# Patient Record
Sex: Female | Born: 1965
Health system: Southern US, Community
[De-identification: ages and names within clinical notes are randomized; demographics above are authoritative.]

## PROBLEM LIST (undated history)

## (undated) DIAGNOSIS — I10 Essential (primary) hypertension: Secondary | ICD-10-CM

---

## 2002-02-28 ENCOUNTER — Encounter (INDEPENDENT_AMBULATORY_CARE_PROVIDER_SITE_OTHER): Payer: Self-pay | Admitting: *Deleted

## 2002-02-28 LAB — CONVERTED CEMR LAB

## 2002-05-28 ENCOUNTER — Encounter: Admission: RE | Admit: 2002-05-28 | Discharge: 2002-05-28 | Payer: Self-pay | Admitting: Family Medicine

## 2002-08-11 ENCOUNTER — Ambulatory Visit (HOSPITAL_COMMUNITY): Admission: RE | Admit: 2002-08-11 | Discharge: 2002-08-11 | Payer: Self-pay | Admitting: Family Medicine

## 2002-08-11 ENCOUNTER — Encounter: Payer: Self-pay | Admitting: Family Medicine

## 2002-10-29 ENCOUNTER — Inpatient Hospital Stay (HOSPITAL_COMMUNITY): Admission: RE | Admit: 2002-10-29 | Discharge: 2002-10-31 | Payer: Self-pay | Admitting: Obstetrics and Gynecology

## 2003-11-30 ENCOUNTER — Observation Stay (HOSPITAL_COMMUNITY): Admission: RE | Admit: 2003-11-30 | Discharge: 2003-12-01 | Payer: Self-pay | Admitting: *Deleted

## 2004-08-21 ENCOUNTER — Other Ambulatory Visit: Admission: RE | Admit: 2004-08-21 | Discharge: 2004-08-21 | Payer: Self-pay | Admitting: *Deleted

## 2005-04-30 HISTORY — PX: TOTAL ABDOMINAL HYSTERECTOMY W/ BILATERAL SALPINGOOPHORECTOMY: SHX83

## 2005-07-09 ENCOUNTER — Other Ambulatory Visit: Admission: RE | Admit: 2005-07-09 | Discharge: 2005-07-09 | Payer: Self-pay | Admitting: *Deleted

## 2006-06-27 DIAGNOSIS — D259 Leiomyoma of uterus, unspecified: Secondary | ICD-10-CM | POA: Insufficient documentation

## 2006-06-28 ENCOUNTER — Encounter (INDEPENDENT_AMBULATORY_CARE_PROVIDER_SITE_OTHER): Payer: Self-pay | Admitting: *Deleted

## 2006-07-22 ENCOUNTER — Other Ambulatory Visit: Admission: RE | Admit: 2006-07-22 | Discharge: 2006-07-22 | Payer: Self-pay | Admitting: *Deleted

## 2013-12-15 ENCOUNTER — Encounter (HOSPITAL_COMMUNITY): Payer: Self-pay | Admitting: Emergency Medicine

## 2013-12-15 ENCOUNTER — Emergency Department (HOSPITAL_COMMUNITY)
Admission: EM | Admit: 2013-12-15 | Discharge: 2013-12-15 | Disposition: A | Discharge status: Home / Self Care | Payer: Self-pay | Attending: Emergency Medicine | Admitting: Emergency Medicine

## 2013-12-15 DIAGNOSIS — H5710 Ocular pain, unspecified eye: Secondary | ICD-10-CM | POA: Insufficient documentation

## 2013-12-15 DIAGNOSIS — I10 Essential (primary) hypertension: Secondary | ICD-10-CM | POA: Insufficient documentation

## 2013-12-15 DIAGNOSIS — H5789 Other specified disorders of eye and adnexa: Secondary | ICD-10-CM | POA: Insufficient documentation

## 2013-12-15 DIAGNOSIS — J069 Acute upper respiratory infection, unspecified: Secondary | ICD-10-CM | POA: Insufficient documentation

## 2013-12-15 DIAGNOSIS — J029 Acute pharyngitis, unspecified: Secondary | ICD-10-CM | POA: Insufficient documentation

## 2013-12-15 HISTORY — DX: Essential (primary) hypertension: I10

## 2013-12-15 LAB — RAPID STREP SCREEN (MED CTR MEBANE ONLY): Streptococcus, Group A Screen (Direct): NEGATIVE

## 2013-12-15 MED ORDER — HYDROCODONE-ACETAMINOPHEN 7.5-325 MG/15ML PO SOLN: 15.0000 mL | ORAL | Status: DC | PRN

## 2013-12-15 MED ORDER — BENZONATATE 100 MG PO CAPS: 100.0000 mg | ORAL_CAPSULE | Freq: Three times a day (TID) | ORAL | Status: DC

## 2013-12-15 MED ORDER — DM-GUAIFENESIN ER 30-600 MG PO TB12: 1.0000 | ORAL_TABLET | Freq: Two times a day (BID) | ORAL | Status: DC

## 2013-12-15 NOTE — ED Provider Notes (Signed)
CSN: [id number]     Arrival date & time 12/15/13  1235 History  This chart was scribed for non-physician practitioner, Harvie Heck, PA-C working with Wandra Arthurs, MD by Frederich Balding, ED scribe. This patient was seen in room TR09C/TR09C and the patient's care was started at 3:06 PM.   Chief Complaint  Patient presents with  . Sore Throat  . Eye Pain   HPI Comments: Shelby Taylor is a 48 y.o. female who presents to the Emergency Department complaining of gradual onset sore throat that started 3-4 days ago. Reports associated nasal congestion, productive cough and subjective fever. States she woke up this morning with her left eye matted shut. Pt has done salt water gargles and drank hot tea with honey with no relief. Denies eye pain, visual changes.  PCP: [facility]  The history is provided by the patient. No language interpreter was used.    Past Medical History  Diagnosis Date  . Hypertension    History reviewed. No pertinent past surgical history. No family history on file. History  Substance Use Topics  . Smoking status: Never Smoker   . Smokeless tobacco: Not on file  . Alcohol Use: Not on file   OB History   Grav Para Term Preterm Abortions TAB SAB Ect Mult Living                 Review of Systems  Constitutional: Positive for fever.  HENT: Positive for congestion and sore throat.   Eyes: Positive for discharge. Negative for pain and visual disturbance.  Respiratory: Positive for cough.   All other systems reviewed and are negative.  Allergies  Review of patient's allergies indicates no known allergies.  Home Medications   Prior to Admission medications   Not on File   BP 129/79  Pulse 88  Temp(Src) 98 F (36.7 C)  Resp 16  SpO2 100%  Physical Exam  Nursing note and vitals reviewed. Constitutional: She is oriented to person, place, and time. She appears well-developed and well-nourished.  Non-toxic appearance. She does not have a sickly appearance.  She does not appear ill. No distress.  HENT:  Head: Normocephalic and atraumatic.  Nose: Rhinorrhea present.  Mouth/Throat: Uvula is midline and mucous membranes are normal. No oral lesions. No trismus in the jaw. Posterior oropharyngeal erythema present. No oropharyngeal exudate, posterior oropharyngeal edema or tonsillar abscesses.  Ears ecluded bilaterally.   Eyes: Conjunctivae, EOM and lids are normal. Right eye exhibits no chemosis and no discharge. Left eye exhibits no chemosis and no discharge. Right conjunctiva is not injected. Left conjunctiva is not injected.  Neck: Neck supple.  Pulmonary/Chest: Effort normal and breath sounds normal. No respiratory distress. She has no wheezes. She has no rhonchi. She has no rales.  Musculoskeletal: Normal range of motion.  Lymphadenopathy:       Head (right side): No submental, no submandibular, no tonsillar and no preauricular adenopathy present.       Head (left side): No submental, no submandibular, no tonsillar and no preauricular adenopathy present.    She has no cervical adenopathy.  Neurological: She is alert and oriented to person, place, and time.  Skin: Skin is warm and dry. She is not diaphoretic.  Psychiatric: She has a normal mood and affect. Her behavior is normal.    ED Course  Procedures (including critical care time)  COORDINATION OF CARE: 3:10 PM-Discussed treatment plan which includes mucinex and symptomatic treatment with pt at bedside and pt agreed to plan.  Labs Review Labs Reviewed  RAPID STREP SCREEN  CULTURE, GROUP A STREP    Imaging Review No results found.   EKG Interpretation None      MDM   Final diagnoses:  URI, acute   Pt rapid strep negative. Patients symptoms are consistent with URI, likely viral etiology. Discussed that antibiotics are not indicated for viral infections. Pt will be discharged with symptomatic treatment.  Verbalizes understanding and is agreeable with plan. Pt is  hemodynamically stable & in NAD prior to dc. Meds given in ED:  Medications - No data to display  New Prescriptions   BENZONATATE (TESSALON) 100 MG CAPSULE    Take 1 capsule (100 mg total) by mouth every 8 (eight) hours.   DEXTROMETHORPHAN-GUAIFENESIN (MUCINEX DM) 30-600 MG PER 12 HR TABLET    Take 1 tablet by mouth 2 (two) times daily.   HYDROCODONE-ACETAMINOPHEN (HYCET) 7.5-325 MG/15 ML SOLUTION    Take 15 mLs by mouth every 4 (four) hours as needed for moderate pain or severe pain.   I personally performed the services described in this documentation, which was scribed in my presence. The recorded information has been reviewed and is accurate.  Harvie Heck, PA-C 12/15/13 [phone]

## 2013-12-15 NOTE — ED Notes (Signed)
Sore throaR And eye drainage x 1 week

## 2013-12-15 NOTE — Discharge Instructions (Signed)
Call for a follow up appointment with a Family or Primary Care Provider.  Return if Symptoms worsen.   Take medication as prescribed.  Salt water gargles 3-4 times a day.

## 2013-12-16 NOTE — ED Provider Notes (Signed)
Medical screening examination/treatment/procedure(s) were performed by non-physician practitioner and as supervising physician I was immediately available for consultation/collaboration.   EKG Interpretation None        Wandra Arthurs, MD 12/16/13 1045

## 2013-12-17 LAB — CULTURE, GROUP A STREP

## 2017-01-24 ENCOUNTER — Encounter: Payer: Self-pay | Admitting: Gastroenterology

## 2017-02-05 ENCOUNTER — Encounter: Payer: Self-pay | Admitting: Specialist

## 2017-02-19 ENCOUNTER — Encounter: Payer: Self-pay | Admitting: Gastroenterology

## 2017-04-03 ENCOUNTER — Ambulatory Visit: Payer: Self-pay | Admitting: Internal Medicine

## 2017-04-03 ENCOUNTER — Encounter: Payer: Self-pay | Admitting: Internal Medicine

## 2017-04-03 ENCOUNTER — Other Ambulatory Visit: Payer: Self-pay

## 2017-04-03 VITALS — BP 143/85 | HR 94 | Temp 98.0°F | Ht 64.0 in | Wt 170.7 lb

## 2017-04-03 DIAGNOSIS — Z021 Encounter for pre-employment examination: Secondary | ICD-10-CM

## 2017-04-03 DIAGNOSIS — D259 Leiomyoma of uterus, unspecified: Secondary | ICD-10-CM

## 2017-04-03 DIAGNOSIS — I10 Essential (primary) hypertension: Secondary | ICD-10-CM | POA: Insufficient documentation

## 2017-04-03 DIAGNOSIS — R7989 Other specified abnormal findings of blood chemistry: Secondary | ICD-10-CM

## 2017-04-03 NOTE — Progress Notes (Addendum)
CC: Hypertension, establish care with clinic  HPI:  [name] is a 51 y.o. HTN who presents today to establish care with the clinic. The patient does not have any acute complaints today but has paperwork to be completed for her pre-employment/schooling physical. The patient has a Masters degree in Engineering but plans to return to school for a nursing degree. She states that she does not have many medical problems and is typically rather healthy. She only takes one medication for hypertension and does not have any difficulty taking this medication. The patient was previously established with [facility] and was told prior to leaving this group that her thyroid hormone was too low. At that time she endorsed symptoms of intermittent palpitations and diaphoresis. She still experiences these symptoms intermittently but they have not occurred as frequently in the past two weeks. Her last TSH was measured to be 0.29 on 01/29/2017. Previous records show a TSH of 0.54, free T4 of 1.5 (total T4 = 10.9), and total T3 of 120 on 01/25/2017.  The patient reports previous history of uterine fibroids, which resulted in a total abdominal hysterectomy and BSO. No other surgeries or medical conditions.   Past Medical History: Past Medical History:  Diagnosis Date  . Hypertension    Family History: Family History  Problem Relation Age of Onset  . Diabetes Mother   . Hypertension Mother   . Diabetes Maternal Grandmother   . Hypertension Maternal Grandmother   . Prostate cancer Maternal Grandfather 40  . Cervical cancer Maternal Aunt 40       Deceased   Surgical History: Past Surgical History:  Procedure Laterality Date  . TOTAL ABDOMINAL HYSTERECTOMY W/ BILATERAL SALPINGOOPHORECTOMY  2007   Hx of Fibroids, ovaries removed 2/2 cysts and adhesions to uterus; no hx of abnormal pap smear   Social History: Social History   Tobacco Use  . Smoking status: Never Smoker  . Smokeless  tobacco: Never Used  Substance Use Topics  . Alcohol use: Yes    Alcohol/week: 0.6 oz    Types: 1 Glasses of wine per week    Comment: Social  . Drug use: No   Review of Systems:   Patient endorses intermittent palpitations, as per HPI Patient denies chest pain, shortness of breath, abdominal pain, diaphoresis, nausea/vomiting, lower extremity swelling, and change in bowel/bladder habits.  Physical Exam:  Vitals:   04/03/17 1322  BP: (!) 143/85  Pulse: 94  Temp: 98 F (36.7 C)  TempSrc: Oral  SpO2: 100%  Weight: 170 lb 11.2 oz (77.4 kg)  Height: 5\' 4"  (1.626 m)    Physical Exam  Constitutional: She appears well-developed and well-nourished. No distress.  HENT:  Mouth/Throat: Oropharynx is clear and moist. No oropharyngeal exudate.  Neck: Thyromegaly (Mild diffuse thyromegaly (R>L) without discrete nodules) present.  Cardiovascular: Normal rate, regular rhythm and intact distal pulses. Exam reveals no friction rub.  No murmur heard. Respiratory: Effort normal and breath sounds normal. No respiratory distress. She has no wheezes.  No crackles appreciated  GI: Soft. Bowel sounds are normal. She exhibits no distension. There is no tenderness.  Musculoskeletal: She exhibits no edema (of bilateral lower extremities) or tenderness (of bilateral lower extremities).  Lymphadenopathy:    She has no cervical adenopathy.  Neurological:  Face strength and sensation intact bilaterally. Tongue midline. Gross motor and sensation to light touch of upper and lower extremities intact bilaterally.  Skin: Skin is warm and dry. No rash noted. She is not diaphoretic.  No erythema. No pallor.  Psychiatric: She has a normal mood and affect. Judgment and thought content normal.   Assessment & Plan:   See Encounters Tab for problem based charting.  Patient seen with Dr. Dareen Piano.

## 2017-04-03 NOTE — Patient Instructions (Addendum)
FOLLOW-UP INSTRUCTIONS When: 1-3 months with PCP For: Management of HTN and abnormal thyroid labs  Thank you for seeing [place] in the clinic today!  Please return to the clinic for your appointment with [facility].  If you have any questions or concerns, please call our clinic at [phone] between 9am-5pm and after hours call [phone] and ask for the internal medicine resident on call. If you feel you are having a medical emergency please call 911.   Dr. Thomasene Ripple

## 2017-04-03 NOTE — Assessment & Plan Note (Signed)
TSH reported to be low/low-normal at past visits in Sept and Oct 2018, without elevated free T4 on blood work. Patient not currently tachycardic or diaphoretic. No unintentional weight loss, skin/hair changes, or changes in bowel/bladder. Intermittent palpitations are concerning for hyperthyroidism, especially given report of low TSH. Patient reports normal TSH after her blood work in October but does not have this laboratory study available. Since patient self-pay at the moment, will defer testing until follow up after financial appointment with Bonna Gains later this month. Asked patient to get record of TSH/T4 testing that occurred after October 2018 and bring record to clinic.  Plan: -RTC 1-3 months (after orange card) -TSH and free T4 at follow up visit -Obtain outside records of additional TSH testing

## 2017-04-03 NOTE — Assessment & Plan Note (Addendum)
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 Patient's school also requires quantiferon gold testing for TB, does not accept two step skin testing. Will order laboratory studies today and plan to provide immunizations if needed.  Plan: -Varicella/Hepatitis B immunity -Quantiferon Gold testing  Addendum: Patient with good varicella immunity, no Hep B immunity, and Quantiferon gold pending. Will call patient to arrange nursing visit for Hep B series completion.

## 2017-04-03 NOTE — Assessment & Plan Note (Signed)
Patients BP near goal of <140/90 today. Reports taking BP at home with levels below goal on current regimen. Will not make changes at this time, but consider at follow up if BP >140/90 on repeat testing.  Plan: -Continue losartan-HCTZ 50-12.5mg  daily -BMP at follow up -RTC in 1-3 months for reevaluation

## 2017-04-03 NOTE — Progress Notes (Signed)
Internal Medicine Clinic Attending  I saw and evaluated the patient.  I personally confirmed the key portions of the history and exam documented by Dr. Nedrud and I reviewed pertinent patient test results.  The assessment, diagnosis, and plan were formulated together and I agree with the documentation in the resident's note.  

## 2017-04-04 ENCOUNTER — Encounter: Payer: Self-pay | Admitting: Internal Medicine

## 2017-04-05 ENCOUNTER — Telehealth: Payer: Self-pay | Admitting: Internal Medicine

## 2017-04-05 LAB — VARICELLA ZOSTER ABS, IGG/IGM: VARICELLA: 1614 {index} (ref >165)

## 2017-04-05 LAB — QUANTIFERON-TB GOLD PLUS
QUANTIFERON TB2 AG VALUE: 0.03 [IU]/mL
QUANTIFERON-TB GOLD PLUS: NEGATIVE
QuantiFERON Mitogen Value: >10 IU/mL
QuantiFERON Nil Value: 0.03 IU/mL
QuantiFERON TB1 Ag Value: 0.03 IU/mL

## 2017-04-05 LAB — HEPATITIS B SURFACE ANTIGEN: HEP B S AG: NEGATIVE

## 2017-04-05 LAB — HEPATITIS B SURFACE ANTIBODY,QUALITATIVE: HEP B SURFACE AB, QUAL: NONREACTIVE

## 2017-04-05 NOTE — Telephone Encounter (Addendum)
Called patient to inform her of test results showing good Varicella immunity but no Hepatitis B immunity. Informed patient that she will need Hepatitis B vaccine and will have to come to clinic to get vaccine administered.   Patient expressed understanding of this and plans to return to clinic to get vaccine early next week, as her paperwork for school is due on 04/12/2017.  Spoke to nurse Ulis Rias who is aware that patient needs vaccine and will facilitate administration of this vaccine! Thanks!

## 2017-04-05 NOTE — Telephone Encounter (Signed)
Call made to patient to see when she would like to start her 1st  Hep b vaccine.  Pt scheduled for Wed Dec 12th at 10am.  She will need papework to take with her stating she received the vaccine and is aware that it is a 3 shot vaccine.Despina Hidden Cassady12/7/20184:42 PM

## 2017-04-10 ENCOUNTER — Ambulatory Visit (INDEPENDENT_AMBULATORY_CARE_PROVIDER_SITE_OTHER): Payer: Self-pay | Admitting: *Deleted

## 2017-04-10 DIAGNOSIS — Z23 Encounter for immunization: Secondary | ICD-10-CM

## 2017-04-10 DIAGNOSIS — Z0184 Encounter for antibody response examination: Secondary | ICD-10-CM | POA: Insufficient documentation

## 2017-04-10 DIAGNOSIS — Z789 Other specified health status: Principal | ICD-10-CM

## 2017-04-16 ENCOUNTER — Ambulatory Visit: Payer: Medicaid Other

## 2017-05-10 ENCOUNTER — Ambulatory Visit (INDEPENDENT_AMBULATORY_CARE_PROVIDER_SITE_OTHER): Payer: Self-pay | Admitting: *Deleted

## 2017-05-10 DIAGNOSIS — Z789 Other specified health status: Secondary | ICD-10-CM

## 2017-05-10 DIAGNOSIS — Z0184 Encounter for antibody response examination: Secondary | ICD-10-CM

## 2017-05-10 DIAGNOSIS — Z23 Encounter for immunization: Secondary | ICD-10-CM

## 2017-05-10 MED ORDER — HEPATITIS B VAC RECOMBINANT 10 MCG/ML IJ SUSP: 1.0000 mL | Freq: Once | INTRAMUSCULAR | Status: DC

## 2017-05-13 ENCOUNTER — Telehealth: Payer: Self-pay | Admitting: Internal Medicine

## 2017-05-13 NOTE — Telephone Encounter (Signed)
CALLED PT. LMTCB, STILL MISSING PAPER WORK TO COMPLETE NEW APP FOR GCCN/CAFA

## 2017-06-03 ENCOUNTER — Other Ambulatory Visit: Payer: Self-pay

## 2017-06-03 ENCOUNTER — Encounter: Payer: Self-pay | Admitting: Internal Medicine

## 2017-06-03 ENCOUNTER — Ambulatory Visit (INDEPENDENT_AMBULATORY_CARE_PROVIDER_SITE_OTHER): Payer: Self-pay | Admitting: Internal Medicine

## 2017-06-03 VITALS — BP 150/89 | HR 108 | Temp 98.5°F | Ht 64.0 in | Wt 170.5 lb

## 2017-06-03 DIAGNOSIS — R000 Tachycardia, unspecified: Secondary | ICD-10-CM

## 2017-06-03 DIAGNOSIS — I10 Essential (primary) hypertension: Secondary | ICD-10-CM

## 2017-06-03 DIAGNOSIS — R7989 Other specified abnormal findings of blood chemistry: Secondary | ICD-10-CM

## 2017-06-03 DIAGNOSIS — E010 Iodine-deficiency related diffuse (endemic) goiter: Secondary | ICD-10-CM

## 2017-06-03 DIAGNOSIS — Z79899 Other long term (current) drug therapy: Secondary | ICD-10-CM

## 2017-06-03 DIAGNOSIS — R002 Palpitations: Secondary | ICD-10-CM

## 2017-06-03 MED ORDER — LOSARTAN POTASSIUM-HCTZ 100-12.5 MG PO TABS
1.0000 | ORAL_TABLET | Freq: Every day | ORAL | 3 refills | Status: DC
Start: 2017-06-03 — End: 2017-08-26

## 2017-06-03 NOTE — Assessment & Plan Note (Addendum)
BP elevated above goal of 140/90 today. Patient is off BP medications today, but reports taking daily at home. It sounds like BP at home is still >140 even with medications per patient report. Patient instructed on diet and lifestyle modifications, including weight loss and low salt intake, that will help with BP control. Will also increase current regimen of losartan-HCTZ 50-12.5mg  to 100-12.5 mg daily, as patient reluctant to increase diuretic. Will also plant to obtain BMP today to monitor Cr function.  Patient also reports history of elevated cholesterol and requests additional lipid panel today. Patient unsure of which cholesterol levels were elevated. Patient ate 1 hour prior to arrival. Will defer labs today and obtain fasting labs at next visit to screen for lipid abnormalities. Patient educated about fasting labs prior to next visit.  Plan: -BMP today -Increase to losartan-HCTZ 100-12.5mg  daily -Referral to clinic dietician for weight loss and low salt diet -Lipid panel at next visit -RTC in 1 month for BP recheck

## 2017-06-03 NOTE — Progress Notes (Signed)
Internal Medicine Clinic Attending  Case discussed with Dr. Nedrud at the time of the visit.  We reviewed the resident's history and exam and pertinent patient test results.  I agree with the assessment, diagnosis, and plan of care documented in the resident's note.  

## 2017-06-03 NOTE — Progress Notes (Signed)
   CC: Hypertension follow up  HPI:  Shelby Taylor is a 52 y.o. with PMH of hypertension who presents today for management of her chronic medical conditions. Patient established with clinic on 04/03/2017. At that time patient required vaccinations for school and did not have orange card approval yet. 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 Patient has been able to take all medications without difficulty, however she ran out of her normal BP medication this AM so is without medicine today. Patient takes her BP 2 times a day and states that the SBP usually runs in 130s to 140s. Denies headaches, shortness of breath, and changes in vision.   Patient states that she notices intermittent palpitations, which can occur at any time. They usually do not bother her, however she has started to notice them more since wearing a fitbit which monitors her HR. The other day during class patient noticed HR >115, which came down after she started doing some deep breathing. No diaphoresis, changes in skin, heat/cold intolerance, and unintentional weight loss.  Past Medical History: Past Medical History:  Diagnosis Date  . Hypertension    Review of Systems:   Patient endorses palpitations, as per HPI Patient denies chest pain, shortness of breath, abdominal pain, diaphoresis, nausea/vomiting, lower extremity swelling, and change in bowel/bladder habits.  Physical Exam:  Vitals:   06/03/17 1332  BP: (!) 150/89  Pulse: (!) 108  Temp: 98.5 F (36.9 C)  TempSrc: Oral  SpO2: 98%  Weight: 170 lb 8 oz (77.3 kg)  Height: 5\' 4"  (1.626 m)    Physical Exam  Constitutional: She appears well-developed and well-nourished. No distress.  HENT:  Mouth/Throat: Oropharynx is clear and moist.  Eyes: Conjunctivae are normal. Right eye exhibits no discharge. Left eye exhibits no discharge. No scleral icterus.  Neck: Thyromegaly (Diffuse, nontender  right sided thyromegaly without evidence of nodularity) present.  Cardiovascular: Regular rhythm and intact distal pulses. Exam reveals no friction rub.  No murmur heard. Tachycardic during exam  Respiratory: Effort normal. No respiratory distress. She has no wheezes. She has no rales.  GI: Soft. Bowel sounds are normal. She exhibits no distension. There is no tenderness. There is no rebound.  Musculoskeletal: Normal range of motion. She exhibits no edema (of bilateral lower extremities) or tenderness (of bilateral lower extremities).  Skin: Skin is warm and dry. No rash noted. She is not diaphoretic. No erythema.    Assessment & Plan:   See Encounters Tab for problem based charting.  Patient discussed with Dr. Lynnae January.

## 2017-06-03 NOTE — Patient Instructions (Addendum)
Thank you for seeing [place] in the clinic today!  You were evaluated for high blood pressure and abnormal thyroid laboratory studies in the past.   For your blood pressure, we increased your medication today. Please start taking losartan-HCTZ 100-12.5 mg daily. Please use the paperwork provided during today's visit to take your blood pressure at home and bring this paper to your next visit so we can see how this medication works for you. If you start to experience dizziness, particularly with standing, please stop taking this medication and call the clinic for an appointment to be evaluated.   For your thyroid, we performed some labs today. I will call you within 1 week to discuss the results of these laboratory studies.  Please return to the clinic in 4 weeks for follow up of your high blood pressure. Please try to schedule clinical dietician follow up on the same day.  If you have any questions or concerns, please call our clinic at [phone] between the hours of 9am-5pm. If you have a problem after these hours, please call [phone] and ask for the internal medicine resident on call. If you feel you are having a medical emergency please call 911.   Thanks, Dr. Larena Glassman Seryna Marek

## 2017-06-03 NOTE — Assessment & Plan Note (Addendum)
Patient has some symptoms of hyperthyroidism, including possible enlargement of thyroid gland, intermittent tachycardia and palpitations. Patient does not have unintentional weight loss or other symptoms of hyperthyroid disease. TSH was 0.29 on 01/29/2017 at outside clinic. Will plan to repeat testing today (with free T3/T4 levels today) and follow up with possible treatment of underlying hyperthyroidism if indicated.  Plan: -TSH, free T3/4 today -Patient will be contacted with results later this week

## 2017-06-04 LAB — BMP8+ANION GAP
Anion Gap: 17 mmol/L (ref 10.0–18.0)
BUN / CREAT RATIO: 17 (ref 9–23)
BUN: 11 mg/dL (ref 6–24)
CO2: 25 mmol/L (ref 20–29)
CREATININE: 0.64 mg/dL (ref 0.57–1.00)
Calcium: 9.4 mg/dL (ref 8.7–10.2)
Chloride: 104 mmol/L (ref 96–106)
GFR calc Af Amer: 119 mL/min/{1.73_m2} (ref >59)
GFR calc non Af Amer: 104 mL/min/{1.73_m2} (ref >59)
Glucose: 125 mg/dL — ABNORMAL HIGH (ref 65–99)
Potassium: 4 mmol/L (ref 3.5–5.2)
SODIUM: 146 mmol/L — AB (ref 134–144)

## 2017-06-04 LAB — T4, FREE: FREE T4: 1.32 ng/dL (ref 0.82–1.77)

## 2017-06-04 LAB — T3, FREE: T3, Free: 3.5 pg/mL (ref 2.0–4.4)

## 2017-06-04 LAB — TSH: TSH: 0.663 u[IU]/mL (ref 0.450–4.500)

## 2017-06-26 ENCOUNTER — Encounter: Payer: Self-pay | Admitting: Internal Medicine

## 2017-08-26 ENCOUNTER — Encounter: Payer: Self-pay | Admitting: Internal Medicine

## 2017-08-26 ENCOUNTER — Ambulatory Visit (INDEPENDENT_AMBULATORY_CARE_PROVIDER_SITE_OTHER): Payer: Self-pay | Admitting: Internal Medicine

## 2017-08-26 ENCOUNTER — Encounter: Payer: Medicaid Other | Admitting: Internal Medicine

## 2017-08-26 ENCOUNTER — Other Ambulatory Visit: Payer: Self-pay

## 2017-08-26 ENCOUNTER — Ambulatory Visit: Payer: Medicaid Other | Admitting: Dietician

## 2017-08-26 VITALS — BP 151/96 | HR 83 | Temp 98.1°F | Ht 64.0 in | Wt 170.6 lb

## 2017-08-26 DIAGNOSIS — Z90710 Acquired absence of both cervix and uterus: Secondary | ICD-10-CM

## 2017-08-26 DIAGNOSIS — Z23 Encounter for immunization: Secondary | ICD-10-CM

## 2017-08-26 DIAGNOSIS — Z8742 Personal history of other diseases of the female genital tract: Secondary | ICD-10-CM

## 2017-08-26 DIAGNOSIS — Z79899 Other long term (current) drug therapy: Secondary | ICD-10-CM

## 2017-08-26 DIAGNOSIS — Z1231 Encounter for screening mammogram for malignant neoplasm of breast: Secondary | ICD-10-CM

## 2017-08-26 DIAGNOSIS — I10 Essential (primary) hypertension: Secondary | ICD-10-CM

## 2017-08-26 DIAGNOSIS — Z0000 Encounter for general adult medical examination without abnormal findings: Secondary | ICD-10-CM | POA: Insufficient documentation

## 2017-08-26 MED ORDER — LOSARTAN POTASSIUM-HCTZ 50-12.5 MG PO TABS: 1.0000 | ORAL_TABLET | Freq: Every day | ORAL | 3 refills | Status: DC

## 2017-08-26 NOTE — Patient Instructions (Addendum)
Thank you for seeing [place] in the clinic today!  You were evaluated for high blood pressure. Please continue taking your combination pill as previously prescribed and checking your blood pressure daily - you are doing great!  You were given a referral for mammogram today. Please fill out the paper work for a potential scholarship for this exam and ask them about this paperwork when they call you to schedule this test.  Please return to the clinic in 3-6 months for follow up of your chronic medical conditions.   If you have any questions or concerns, please call our clinic at [phone] between the hours of 9am-5pm. If you have a problem after these hours, please call [phone] and ask for the internal medicine resident on call. If you feel you are having a medical emergency please call 911.   Thanks, Dr. Larena Glassman Shelby Taylor

## 2017-08-26 NOTE — Progress Notes (Signed)
   CC: HTN Follow up  HPI:  [name] is a 52 y.o. with PMH of hypertension who presents for management of this chronic medical condition. At last visit patient's BP elevated and dose of losartan-HCTZ increased from 50-12.5mg  to 100-12.5mg  daily. Patient states that since her last clinic visit on 05/2017 she has felt overall well. She made the above recommended change to BP regimen for a short while but noticed a change in her urine output after implementing this change. No dysuria, or increased urinary frequency, but she noticed a change in odor of urine. Patient self-decreased dose back to 50-12.5 mg and noticed resolution of urinary symptoms after this change. NO headaches, focal weakness, chest pain, or dyspnea.  Past Medical History: Past Medical History:  Diagnosis Date  . Hypertension    Review of Systems:  Patient denies abdominal pain, diaphoresis, nausea/vomiting, lower extremity swelling, and change in bowel/bladder habits.  Physical Exam:  Vitals:   08/26/17 1552  BP: (!) 147/94  Pulse: 84  Temp: 98.1 F (36.7 C)  TempSrc: Oral  SpO2: 100%  Weight: 170 lb 9.6 oz (77.4 kg)  Height: 5\' 4"  (1.626 m)   Physical Exam  Constitutional: She appears well-developed and well-nourished. No distress.  HENT:  Mouth/Throat: Oropharynx is clear and moist.  Eyes: Conjunctivae and EOM are normal.  Cardiovascular: Normal rate, regular rhythm and intact distal pulses. Exam reveals no friction rub.  No murmur heard. Respiratory: Effort normal. No respiratory distress. She has no wheezes. She has no rales.  Musculoskeletal: She exhibits no edema (of bilateral lower extremities) or tenderness (of bilateral lower extremities).  Skin: Skin is warm and dry. No rash noted. She is not diaphoretic. No erythema.     Assessment & Plan:   See Encounters Tab for problem based charting.  Patient discussed with Dr. Angelia Mould.

## 2017-08-26 NOTE — Progress Notes (Signed)
  Medical Nutrition Therapy:  Appt start time: 0017 end time:  4944 Visit # 1  Assessment:  Primary concerns today: help with diet for high blood pressure.  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 She has noticed high blood pressure it the past despite what she thought was a healthy diet. She began eating a vegan diet with green smoothies at least once daily and saw a significant decrease in her blood pressure. She has since added back some animal sources of protein and is trying to adhere to a mostly vegetarian diet.   Preferred Learning Style: no preference indicated  Learning Readiness:Ready and Change in progress  ANTHROPOMETRICS:Estimated body mass index is 29.28 kg/m as calculated from the following:   Height as of an earlier encounter on 08/26/17: 5\' 4"  (1.626 m).   Weight as of an earlier encounter on 08/26/17: 170 lb 9.6 oz (77.4 kg). WEIGHT HISTORY: 150-170#, prefers 150# but realizes it may take her a while to get there MEDICATIONS:  DIETARY INTAKE: Usual eating pattern includes 3 meals and unsure how many snacks per day. Everyday foods include nuts, eggs, vegan lunch meat, fish, vegetables, fruits.  Avoided foods include meats on most days.  Dining Out: yes  24-hr recall:  B ( AM): green smoothie or 2 boiled eggs and vegan sausage, fruit  L ( PM): green smoothie D ( PM): fish and vegetables Snks: mixture of unsalted nuts Beverages: tea, water  Usual physical activity: 6000-10000 steps a day  Progress Towards Goal(s):  In progress.   Nutritional Diagnosis:  NB-1.1 Food and nutrition-related knowledge deficit As related to lack of sufficent training to be confident in her dietary knowlege .  As evidenced by her questions and concerns and dietary changes.    Intervention:  Nutrition education about DASH diet, menu and recipe ideas and importance eof maintaining physical acitivity. Agree with moderate (15#) weight loss)  Coordination of care: consider de-escalating  medications as possible Teaching Method Utilized: Visual, Auditory, Hands on Handouts given during visit include:AVS and DASH diet Barriers to learning/adherence to lifestyle change: competing values Demonstrated degree of understanding via:  Teach Back   Monitoring/Evaluation:  Dietary intake, exercise, and body weight prn  Debera Lat, RD 08/26/2017 4:26 PM. .

## 2017-08-26 NOTE — Assessment & Plan Note (Signed)
Patient needs TDAP today - vaccine given  Patient also needs screening mammography - referral and application for scholarship given.  Patient requested pap smear at future visit (had to leave clinic today for scheduled class) - patient had hysterectomy for fibroids and no abnormal pap smears in the past. Discussed that patient likely does not need pap smear given this history (but absence of records for hysterectomy in our system).   Discussed FIT testing with patient vs. Colonoscopy (which patient reluctant to try at this time). Recommend introducing FIT testing at next visit.  Patient not yet tested for HIV - reports negative test in the past but amenable for repeat testing in the future.

## 2017-08-26 NOTE — Assessment & Plan Note (Signed)
Patient's BP elevated >140/90 today. Patient takes BP twice daily (once in AM and once before bedtime). She states that her SBP at home ranges from 100-130, never as high as what was observed today in clinic. Patient self decreased losartan from 100 mg to 50 mg after last clinic visit. Patient reluctant to add new medication to regimen today, especially after meeting with clinic dietician regarding lifestyle and diet modifications for further management of HTN. Patient would like to hold off on making changes to BP regimen so that she can see if weight loss affects BP.   Plan: -Continue losartan -HCTZ 50-12.5mg  daily -Diet and lifestyle modifications -RTC in 3-6 months for evaluation. Would suggest addition of low dose amlodipine to regimen if BP elevated at that time.

## 2017-08-26 NOTE — Patient Instructions (Signed)
Dear Jonne Ply,  It was a pleasure meeting you! You are doing a great job transitioning your diet to just about a DASH diet. This diet has been proven to lwoer blood pressure. I recommend you continue this for life.    Here is the website for more about DASH diet meal planning: NightlifePreviews.ch  Maintaining a moderate level of Physical activity throughout life is very important for [place] to utilize the good nutrtion the food is giving [place].   Here are current minimal recommendations/ Your Exercise Prescription:   RX:  1- At least 150 minutes a week of moderate intensity exercise. ( 50 minutes on 3 days or 30 minues on 5 days or however it works for you!)  Trying not to skip more than 2 days with no exercise. Walking, bicycling, stationary bicycling, dancing.  (moderate intensity means to get  a little out of breath)  2- Do resistance exercise at least 2 times a week.  This can be yoga poses or strength training where you lift your own weight (think leg lifts or toe raises)  or light weights like cans of beans with your arms or buy some inexpensive dumbbells (play it again sports or [employer] or [employer] dress for less)  To do light weights with exercise - arms- curls, tricep extension or dips, push ups- can do on knees or on the wall - legs- squats, Lunges, calf raises on steps or block  3- Flexibility and balance exercise-yoga, stretches,  safe stretching and practicing balance is very important to health.   Call or stop by anytime! Butch Penny [phone]

## 2017-08-27 NOTE — Progress Notes (Signed)
Internal Medicine Clinic Attending  Case discussed with Dr. Nedrud at the time of the visit.  We reviewed the resident's history and exam and pertinent patient test results.  I agree with the assessment, diagnosis, and plan of care documented in the resident's note.  

## 2017-09-12 ENCOUNTER — Ambulatory Visit: Payer: Medicaid Other | Admitting: Dietician

## 2017-10-15 ENCOUNTER — Ambulatory Visit (INDEPENDENT_AMBULATORY_CARE_PROVIDER_SITE_OTHER): Payer: Self-pay | Admitting: *Deleted

## 2017-10-15 DIAGNOSIS — Z23 Encounter for immunization: Secondary | ICD-10-CM

## 2017-10-16 ENCOUNTER — Encounter: Payer: Self-pay | Admitting: *Deleted

## 2017-11-13 ENCOUNTER — Ambulatory Visit: Payer: Medicaid Other

## 2018-03-04 ENCOUNTER — Telehealth: Payer: Self-pay

## 2018-03-04 ENCOUNTER — Other Ambulatory Visit: Payer: Self-pay | Admitting: *Deleted

## 2018-03-04 NOTE — Telephone Encounter (Signed)
Attempted to call pt back, do not understand message, lm for rtc

## 2018-03-04 NOTE — Telephone Encounter (Signed)
Received return phone call from pt.  She is requesting a TB Quantiferon Blood test for entry into nursing school. Will route to pcp.  [place], RN

## 2018-03-04 NOTE — Telephone Encounter (Signed)
Requesting blood work for tb. Please call pt back.

## 2019-09-15 DIAGNOSIS — I10 Essential (primary) hypertension: Secondary | ICD-10-CM | POA: Insufficient documentation

## 2019-09-29 ENCOUNTER — Encounter: Payer: Self-pay | Admitting: Nurse Practitioner

## 2019-09-29 ENCOUNTER — Ambulatory Visit: Payer: 59 | Admitting: Nurse Practitioner

## 2019-09-29 VITALS — BP 142/86 | HR 90 | Temp 98.3°F | Ht 64.6 in | Wt 181.6 lb

## 2019-09-29 DIAGNOSIS — R3121 Asymptomatic microscopic hematuria: Secondary | ICD-10-CM | POA: Diagnosis not present

## 2019-09-29 DIAGNOSIS — Z1211 Encounter for screening for malignant neoplasm of colon: Secondary | ICD-10-CM | POA: Diagnosis not present

## 2019-09-29 DIAGNOSIS — Z1231 Encounter for screening mammogram for malignant neoplasm of breast: Secondary | ICD-10-CM

## 2019-09-29 DIAGNOSIS — I10 Essential (primary) hypertension: Secondary | ICD-10-CM | POA: Diagnosis not present

## 2019-09-29 DIAGNOSIS — E7800 Pure hypercholesterolemia, unspecified: Secondary | ICD-10-CM

## 2019-09-29 DIAGNOSIS — Z13228 Encounter for screening for other metabolic disorders: Secondary | ICD-10-CM

## 2019-09-29 LAB — POCT URINALYSIS DIPSTICK
Bilirubin, UA: NEGATIVE
Glucose, UA: NEGATIVE
Ketones, UA: NEGATIVE
Leukocytes, UA: NEGATIVE
Nitrite, UA: NEGATIVE
Protein, UA: NEGATIVE
Spec Grav, UA: 1.025 (ref 1.010–1.025)
Urobilinogen, UA: 0.2 E.U./dL
pH, UA: 5.5 (ref 5.0–8.0)

## 2019-09-29 LAB — POCT UA - MICROALBUMIN
Albumin/Creatinine Ratio, Urine, POC: 30
Creatinine, POC: 200 mg/dL
Microalbumin Ur, POC: 10 mg/L

## 2019-09-29 NOTE — Progress Notes (Signed)
This visit occurred during the SARS-CoV-2 public health emergency.  Safety protocols were in place, including screening questions prior to the visit, additional usage of staff PPE, and extensive cleaning of exam room while observing appropriate contact time as indicated for disinfecting solutions.  Subjective:     Patient ID: Shelby Taylor , female    DOB: Jun 24, 1965 , 54 y.o.   MRN: [id number]   Chief Complaint  Patient presents with  . Establish Care    HPI  Here to establish care. She had been living in [place].  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 She is also a Surveyor, quantity.  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 She is planning to go back for her BSN at [employer].   She is interested in Aesthetics.  She reports she has gained approximately 30 pounds while in nursing school.  Single. She is raising her nephew who is 94 y/o.    She has recently had a career change to nursing.   PMH - Myomectomy, partial hysterectomy, total hysterectomy. Used to have fibroids. She had done hormone therapy.  HTN (she thinks she may not have taken her medications this morning) (since 2018), night worker (few weeks). She had cyst to her ears which were removed.   [facility] - Father - deceased (gunshot), Mother - glaucoma, HTN, diabetes, TB (when patient was 66 years old). Brother (oldest) - possibly prediabetes, Sister - overall healthy Maternal aunt with colon cancer (deceased) and maternal great aunt (colon cancer).   She had the covid vaccine - 2nd vaccine caused her fatigue, 2 weeks later had soreness to her left arm.    Hypertension This is a chronic problem. The current episode started more than 1 year ago. Pertinent negatives include no chest pain, headaches or palpitations. Risk factors for coronary artery disease include obesity and sedentary lifestyle. Past  treatments include diuretics. The current treatment provides mild improvement. There are no compliance problems.  There is no history of angina. There is no history of chronic renal disease.     Past Medical History:  Diagnosis Date  . Hypertension      Family History  Problem Relation Age of Onset  . Diabetes Mother   . Hypertension Mother   . Diabetes Maternal Grandmother   . Hypertension Maternal Grandmother   . Prostate cancer Maternal Grandfather 49  . Cervical cancer Maternal Aunt 40       Deceased     Current Outpatient Medications:  .  lisinopril-hydrochlorothiazide (ZESTORETIC) 20-25 MG tablet, Take 1 tablet by mouth daily., Disp: , Rfl:  .  losartan-hydrochlorothiazide (HYZAAR) 50-12.5 MG tablet, Take 1 tablet by mouth daily., Disp: 90 tablet, Rfl: 3   Allergies  Allergen Reactions  . Morphine And Related     Itching     Review of Systems  Constitutional: Negative.   Respiratory: Negative.   Cardiovascular: Negative.  Negative for chest pain, palpitations and leg swelling.  Neurological: Negative for dizziness and headaches.  Psychiatric/Behavioral: Negative.      Today's Vitals   09/29/19 1125  BP: (!) 142/86  Pulse: 90  Temp: 98.3 F (36.8 C)  TempSrc: Oral  Weight: 181 lb 9.6 oz (82.4 kg)  Height: 5' 4.6" (1.641 m)  PainSc: 0-No pain   Body mass index is 30.6 kg/m.   Objective:  Physical Exam Constitutional:  General: She is not in acute distress.    Appearance: Normal appearance.  Cardiovascular:     Rate and Rhythm: Normal rate and regular rhythm.     Pulses: Normal pulses.     Heart sounds: Normal heart sounds. No murmur.  Pulmonary:     Effort: Pulmonary effort is normal. No respiratory distress.     Breath sounds: Normal breath sounds.  Skin:    Capillary Refill: Capillary refill takes less than 2 seconds.  Neurological:     General: No focal deficit present.     Mental Status: She is alert and oriented to person, place, and  time.     Cranial Nerves: No cranial nerve deficit.  Psychiatric:        Mood and Affect: Mood normal.        Behavior: Behavior normal.        Thought Content: Thought content normal.        Judgment: Judgment normal.         Assessment And Plan:     1. Essential hypertension  Chronic, Taylor pressure is slightly elevated I will not make any changes at this time, this is her visit to the office  She is to continue with her medications and avoid salt - CMP14+EGFR  2. Asymptomatic microscopic hematuria  Positive Taylor in urine will send for culture - Culture, Urine  3. Elevated cholesterol  Previous labs indicate elevated cholesterol levels  Will check lipid panel.  - Lipid panel  4. Encounter for screening colonoscopy  According to USPTF Colorectal cancer Screening guidelines. Colonoscopy is recommended every 10 years, starting at age 55years.  Will refer to GI for colon cancer screening. - Ambulatory referral to Gastroenterology  5. Encounter for screening mammogram for malignant neoplasm of breast  Pt instructed on Self Breast Exam.According to ACOG guidelines Women aged 54 and older are recommended to get an annual mammogram. Form completed and given to patient contact the [facility] for appointment scheduing.   Pt encouraged to get annual mammogram - MM DIGITAL SCREENING BILATERAL; Future  6. Encounter for screening for metabolic disorder  - Hemoglobin A1c - CMP14+EGFR   [name], FNP    THE PATIENT IS ENCOURAGED TO PRACTICE SOCIAL DISTANCING DUE TO THE COVID-19 PANDEMIC.

## 2019-10-13 DIAGNOSIS — R3121 Asymptomatic microscopic hematuria: Secondary | ICD-10-CM | POA: Diagnosis not present

## 2019-10-13 DIAGNOSIS — E7800 Pure hypercholesterolemia, unspecified: Secondary | ICD-10-CM | POA: Diagnosis not present

## 2019-10-13 DIAGNOSIS — I10 Essential (primary) hypertension: Secondary | ICD-10-CM | POA: Diagnosis not present

## 2019-10-13 DIAGNOSIS — Z13228 Encounter for screening for other metabolic disorders: Secondary | ICD-10-CM | POA: Diagnosis not present

## 2019-10-14 LAB — CMP14+EGFR
ALT: 17 IU/L (ref 0–32)
AST: 19 IU/L (ref 0–40)
Albumin/Globulin Ratio: 1.7 (ref 1.2–2.2)
Albumin: 4.4 g/dL (ref 3.8–4.9)
Alkaline Phosphatase: 89 IU/L (ref 48–121)
BUN/Creatinine Ratio: 18 (ref 9–23)
BUN: 12 mg/dL (ref 6–24)
Bilirubin Total: 0.5 mg/dL (ref 0.0–1.2)
CO2: 22 mmol/L (ref 20–29)
Calcium: 9.6 mg/dL (ref 8.7–10.2)
Chloride: 103 mmol/L (ref 96–106)
Creatinine, Ser: 0.67 mg/dL (ref 0.57–1.00)
GFR calc Af Amer: 115 mL/min/{1.73_m2} (ref >59)
GFR calc non Af Amer: 100 mL/min/{1.73_m2} (ref >59)
Globulin, Total: 2.6 g/dL (ref 1.5–4.5)
Glucose: 103 mg/dL — ABNORMAL HIGH (ref 65–99)
Potassium: 3.9 mmol/L (ref 3.5–5.2)
Sodium: 140 mmol/L (ref 134–144)
Total Protein: 7 g/dL (ref 6.0–8.5)

## 2019-10-14 LAB — LIPID PANEL
Chol/HDL Ratio: 3.9 ratio (ref 0.0–4.4)
Cholesterol, Total: 226 mg/dL — ABNORMAL HIGH (ref 100–199)
HDL: 58 mg/dL (ref >39)
LDL Chol Calc (NIH): 159 mg/dL — ABNORMAL HIGH (ref 0–99)
Triglycerides: 54 mg/dL (ref 0–149)
VLDL Cholesterol Cal: 9 mg/dL (ref 5–40)

## 2019-10-14 LAB — URINE CULTURE: Organism ID, Bacteria: NO GROWTH

## 2019-10-14 LAB — HEMOGLOBIN A1C
Est. average glucose Bld gHb Est-mCnc: 111 mg/dL
Hgb A1c MFr Bld: 5.5 % (ref 4.8–5.6)

## 2019-10-22 ENCOUNTER — Other Ambulatory Visit: Payer: Self-pay | Admitting: Nurse Practitioner

## 2019-10-22 ENCOUNTER — Encounter: Payer: Self-pay | Admitting: Gastroenterology

## 2019-10-22 DIAGNOSIS — Z1231 Encounter for screening mammogram for malignant neoplasm of breast: Secondary | ICD-10-CM

## 2019-11-05 ENCOUNTER — Other Ambulatory Visit: Payer: Self-pay

## 2019-11-05 ENCOUNTER — Ambulatory Visit (HOSPITAL_COMMUNITY)
Admission: EM | Admit: 2019-11-05 | Discharge: 2019-11-05 | Disposition: A | Discharge status: Home / Self Care | Payer: 59 | Attending: Urgent Care | Admitting: Urgent Care

## 2019-11-05 ENCOUNTER — Encounter (HOSPITAL_COMMUNITY): Payer: Self-pay

## 2019-11-05 DIAGNOSIS — Z20822 Contact with and (suspected) exposure to covid-19: Secondary | ICD-10-CM | POA: Insufficient documentation

## 2019-11-05 DIAGNOSIS — R42 Dizziness and giddiness: Secondary | ICD-10-CM | POA: Insufficient documentation

## 2019-11-05 DIAGNOSIS — R519 Headache, unspecified: Secondary | ICD-10-CM | POA: Diagnosis not present

## 2019-11-05 DIAGNOSIS — J3089 Other allergic rhinitis: Secondary | ICD-10-CM

## 2019-11-05 DIAGNOSIS — Z885 Allergy status to narcotic agent status: Secondary | ICD-10-CM | POA: Diagnosis not present

## 2019-11-05 DIAGNOSIS — R0981 Nasal congestion: Secondary | ICD-10-CM | POA: Insufficient documentation

## 2019-11-05 DIAGNOSIS — J309 Allergic rhinitis, unspecified: Secondary | ICD-10-CM | POA: Diagnosis not present

## 2019-11-05 DIAGNOSIS — Z90722 Acquired absence of ovaries, bilateral: Secondary | ICD-10-CM | POA: Diagnosis not present

## 2019-11-05 NOTE — ED Provider Notes (Signed)
[place]   MRN: [id number] DOB: Jun 28, 1965  Subjective:   Shelby Taylor is a 54 y.o. female presenting for 4 day hx of persistent dizziness/vertigo, sinus headaches, pnd. Has used Zyrtec, Flonase with minimal relief. Had good relief the first day. Has had COVID vaccination. Denies smoking, asthma, COPD.   No current facility-administered medications for this encounter.  Current Outpatient Medications:    lisinopril-hydrochlorothiazide (ZESTORETIC) 20-25 MG tablet, Take 1 tablet by mouth daily., Disp: , Rfl:    Allergies  Allergen Reactions   Morphine And Related     Itching    Past Medical History:  Diagnosis Date   Hypertension      Past Surgical History:  Procedure Laterality Date   TOTAL ABDOMINAL HYSTERECTOMY W/ BILATERAL SALPINGOOPHORECTOMY  2007   Hx of Fibroids, ovaries removed 2/2 cysts and adhesions to uterus; no hx of abnormal pap smear    Family History  Problem Relation Age of Onset   Diabetes Mother    Hypertension Mother    Diabetes Maternal Grandmother    Hypertension Maternal Grandmother    Prostate cancer Maternal Grandfather 71   Cervical cancer Maternal Aunt 40       Deceased    Social History   Tobacco Use   Smoking status: Never Smoker   Smokeless tobacco: Never Used  Vaping Use   Vaping Use: Never used  Substance Use Topics   Alcohol use: Yes    Alcohol/week: 1.0 standard drink    Types: 1 Glasses of wine per week    Comment: Social   Drug use: No    ROS   Objective:   Vitals: BP (!) 159/97    Pulse 88    Temp 98.5 F (36.9 C) (Oral)    Resp 16    Ht 5\' 4"  (1.626 m)    Wt 170 lb (77.1 kg)    LMP 08/26/2017    SpO2 100%    BMI 29.18 kg/m   Physical Exam Constitutional:      General: She is not in acute distress.    Appearance: Normal appearance. She is well-developed. She is not ill-appearing, toxic-appearing or diaphoretic.  HENT:     Head: Normocephalic and atraumatic.     Right Ear:  Tympanic membrane, ear canal and external ear normal. No drainage or tenderness. No middle ear effusion. Tympanic membrane is not erythematous.     Left Ear: Tympanic membrane, ear canal and external ear normal. No drainage or tenderness.  No middle ear effusion. Tympanic membrane is not erythematous.     Nose: Nose normal. No congestion or rhinorrhea.     Mouth/Throat:     Mouth: Mucous membranes are moist. No oral lesions.     Pharynx: No pharyngeal swelling, oropharyngeal exudate, posterior oropharyngeal erythema or uvula swelling.     Tonsils: No tonsillar exudate or tonsillar abscesses.     Comments: Significant post-nasal drainage.  Eyes:     General: No scleral icterus.       Right eye: No discharge.        Left eye: No discharge.     Extraocular Movements: Extraocular movements intact.     Right eye: Normal extraocular motion.     Left eye: Normal extraocular motion.     Conjunctiva/sclera: Conjunctivae normal.     Pupils: Pupils are equal, round, and reactive to light.  Cardiovascular:     Rate and Rhythm: Normal rate.  Pulmonary:     Effort: Pulmonary effort is normal.  Musculoskeletal:  Cervical back: Normal range of motion and neck supple.  Lymphadenopathy:     Cervical: No cervical adenopathy.  Skin:    General: Skin is warm and dry.  Neurological:     General: No focal deficit present.     Mental Status: She is alert and oriented to person, place, and time.  Psychiatric:        Mood and Affect: Mood normal.        Behavior: Behavior normal.        Thought Content: Thought content normal.        Judgment: Judgment normal.     Assessment and Plan :   PDMP not reviewed this encounter.  1. Dizziness   2. Allergic rhinitis due to other allergic trigger, unspecified seasonality   3. Sinus headache   4. Nasal congestion     Will use oral prednisone course for rhinitis. Maintain Zyrtec, hold Flonase. COVID testing pending.  Counseled patient on potential for  adverse effects with medications prescribed/recommended today, ER and return-to-clinic precautions discussed, patient verbalized understanding.    Jaynee Eagles, [place] 11/05/19 1929

## 2019-11-05 NOTE — ED Triage Notes (Signed)
Pt states woke up Tues with vertigo and states its not getting better after taking allergy meds to help per PCP. Pt states it helped some the first day, but not helping anymore.

## 2019-11-06 ENCOUNTER — Telehealth: Payer: Self-pay | Admitting: Internal Medicine

## 2019-11-06 LAB — SARS CORONAVIRUS 2 (TAT 6-24 HRS): SARS Coronavirus 2: NEGATIVE

## 2019-11-06 MED ORDER — PREDNISONE 20 MG PO TABS
20.0000 mg | ORAL_TABLET | Freq: Every day | ORAL | 0 refills | Status: AC
Start: 2019-11-06 — End: 2019-11-11

## 2019-11-06 NOTE — Telephone Encounter (Signed)
Prescription didn't go through to the pharmacy. Prescription has been sent again.

## 2019-11-19 ENCOUNTER — Ambulatory Visit
Admission: RE | Admit: 2019-11-19 | Discharge: 2019-11-19 | Disposition: A | Discharge status: Home / Self Care | Payer: 59 | Source: Ambulatory Visit | Attending: Nurse Practitioner | Admitting: Nurse Practitioner

## 2019-11-19 DIAGNOSIS — Z1231 Encounter for screening mammogram for malignant neoplasm of breast: Secondary | ICD-10-CM

## 2019-11-27 ENCOUNTER — Other Ambulatory Visit: Payer: Self-pay

## 2019-11-27 ENCOUNTER — Ambulatory Visit (AMBULATORY_SURGERY_CENTER): Payer: Self-pay | Admitting: *Deleted

## 2019-11-27 VITALS — Ht 64.6 in | Wt 180.0 lb

## 2019-11-27 DIAGNOSIS — Z1211 Encounter for screening for malignant neoplasm of colon: Secondary | ICD-10-CM

## 2019-11-27 MED ORDER — NA SULFATE-K SULFATE-MG SULF 17.5-3.13-1.6 GM/177ML PO SOLN
ORAL | 0 refills | Status: DC
Start: 2019-11-27 — End: 2019-12-11

## 2019-11-27 NOTE — Progress Notes (Signed)
Patient is here in-person for PV. Patient denies any allergies to eggs or soy. Patient denies any problems with anesthesia/sedation. Patient denies any oxygen use at home. Patient denies taking any diet/weight loss medications or blood thinners. Patient is not being treated for MRSA or C-diff. Patient is aware of our care-partner policy and JGOTL-57 safety protocol. EMMI education assisgned to the patient for the procedure, this was explained and instructions given to patient.   COVID-19 vaccines completed on 08/07/19. suprep Prescription coupon given to the patient.

## 2019-11-29 HISTORY — PX: COLONOSCOPY W/ POLYPECTOMY: SHX1380

## 2019-11-30 ENCOUNTER — Encounter: Payer: Self-pay | Admitting: Internal Medicine

## 2019-12-01 MED FILL — SUPREP BOWEL PREP KIT: 17.5-3.13-1 | 2 days supply | Qty: 354 | Fill #0

## 2019-12-10 ENCOUNTER — Encounter: Payer: Self-pay | Admitting: Certified Registered Nurse Anesthetist

## 2019-12-11 ENCOUNTER — Encounter: Payer: Self-pay | Admitting: Internal Medicine

## 2019-12-11 ENCOUNTER — Ambulatory Visit (AMBULATORY_SURGERY_CENTER): Payer: 59 | Admitting: Internal Medicine

## 2019-12-11 ENCOUNTER — Other Ambulatory Visit: Payer: Self-pay

## 2019-12-11 VITALS — BP 113/80 | HR 73 | Temp 97.3°F | Resp 12 | Ht 64.0 in | Wt 180.0 lb

## 2019-12-11 DIAGNOSIS — D125 Benign neoplasm of sigmoid colon: Secondary | ICD-10-CM

## 2019-12-11 DIAGNOSIS — Z1211 Encounter for screening for malignant neoplasm of colon: Secondary | ICD-10-CM | POA: Diagnosis not present

## 2019-12-11 DIAGNOSIS — D123 Benign neoplasm of transverse colon: Secondary | ICD-10-CM | POA: Diagnosis not present

## 2019-12-11 DIAGNOSIS — I10 Essential (primary) hypertension: Secondary | ICD-10-CM | POA: Diagnosis not present

## 2019-12-11 MED ORDER — SODIUM CHLORIDE 0.9 % IV SOLN
500.0000 mL | Freq: Once | INTRAVENOUS | Status: DC
Start: 2019-12-11 — End: 2019-12-11

## 2019-12-11 NOTE — Op Note (Signed)
[place] Patient Name: Shelby Taylor Procedure Date: 12/11/2019 2:08 PM MRN: [id number] Endoscopist: Gatha Mayer , MD Age: 54 Referring MD:  Date of Birth: 1965/12/28 Gender: Female Account #: [id number] Procedure:                Colonoscopy Indications:              Screening for colorectal malignant neoplasm, This                            is the patient's first colonoscopy Medicines:                Propofol per Anesthesia, Monitored Anesthesia Care Procedure:                Pre-Anesthesia Assessment:                           - Prior to the procedure, a History and Physical                            was performed, and patient medications and                            allergies were reviewed. The patient's tolerance of                            previous anesthesia was also reviewed. The risks                            and benefits of the procedure and the sedation                            options and risks were discussed with the patient.                            All questions were answered, and informed consent                            was obtained. Prior Anticoagulants: The patient has                            taken no previous anticoagulant or antiplatelet                            agents. ASA Grade Assessment: II - A patient with                            mild systemic disease. After reviewing the risks                            and benefits, the patient was deemed in                            satisfactory condition to undergo the procedure.  After obtaining informed consent, the colonoscope                            was passed under direct vision. Throughout the                            procedure, the patient's blood pressure, pulse, and                            oxygen saturations were monitored continuously. The                            Colonoscope was introduced through the anus and                             advanced to the the cecum, identified by                            appendiceal orifice and ileocecal valve. The                            colonoscopy was performed without difficulty. The                            patient tolerated the procedure well. The quality                            of the bowel preparation was excellent. The bowel                            preparation used was SUPREP via split dose                            instruction. The ileocecal valve, appendiceal                            orifice, and rectum were photographed. Scope In: 2:17:40 PM Scope Out: 2:31:30 PM Scope Withdrawal Time: 0 hours 12 minutes 1 second  Total Procedure Duration: 0 hours 13 minutes 50 seconds  Findings:                 The perianal and digital rectal examinations were                            normal.                           Four sessile polyps were found in the sigmoid colon                            and transverse colon. The polyps were diminutive in                            size. These polyps were removed with a cold snare.  Resection and retrieval were complete. Verification                            of patient identification for the specimen was                            done. Estimated blood loss was minimal.                           The exam was otherwise without abnormality on                            direct and retroflexion views. Complications:            No immediate complications. Estimated Blood Loss:     Estimated blood loss was minimal. Impression:               - Four diminutive polyps in the sigmoid colon and                            in the transverse colon, removed with a cold snare.                            Resected and retrieved.                           - The examination was otherwise normal on direct                            and retroflexion views. Recommendation:           - Patient has a contact number available for                             emergencies. The signs and symptoms of potential                            delayed complications were discussed with the                            patient. Return to normal activities tomorrow.                            Written discharge instructions were provided to the                            patient.                           - Resume previous diet.                           - Continue present medications.                           - Repeat colonoscopy is recommended. The  colonoscopy date will be determined after pathology                            results from today's exam become available for                            review. Gatha Mayer, MD 12/11/2019 2:42:53 PM This report has been signed electronically.

## 2019-12-11 NOTE — Progress Notes (Signed)
Pt's states no medical or surgical changes since previsit or office visit.  CW - vitals 

## 2019-12-11 NOTE — Patient Instructions (Addendum)
I found and removed 4 tiny polyps.  I am certain they are benign but will prove it.  I will let you know pathology results and when to have another routine colonoscopy by mail and/or My Chart.  I appreciate the opportunity to care for you. Gatha Mayer, MD, [facility]    Handout on polyps given to you    Await pathology results on polyps removed    YOU HAD AN ENDOSCOPIC PROCEDURE TODAY AT [place]:   Refer to the procedure report that was given to you for any specific questions about what was found during the examination.  If the procedure report does not answer your questions, please call your gastroenterologist to clarify.  If you requested that your care partner not be given the details of your procedure findings, then the procedure report has been included in a sealed envelope for you to review at your convenience later.  YOU SHOULD EXPECT: Some feelings of bloating in the abdomen. Passage of more gas than usual.  Walking can help get rid of the air that was put into your GI tract during the procedure and reduce the bloating. If you had a lower endoscopy (such as a colonoscopy or flexible sigmoidoscopy) you may notice spotting of blood in your stool or on the toilet paper. If you underwent a bowel prep for your procedure, you may not have a normal bowel movement for a few days.  Please Note:  You might notice some irritation and congestion in your nose or some drainage.  This is from the oxygen used during your procedure.  There is no need for concern and it should clear up in a day or so.  SYMPTOMS TO REPORT IMMEDIATELY:   Following lower endoscopy (colonoscopy or flexible sigmoidoscopy):  Excessive amounts of blood in the stool  Significant tenderness or worsening of abdominal pains  Swelling of the abdomen that is new, acute  Fever of 100F or higher    For urgent or emergent issues, a gastroenterologist can be reached at any hour by calling [phone]  [phone]. Do not use MyChart messaging for urgent concerns.    DIET:  We do recommend a small meal at first, but then you may proceed to your regular diet.  Drink plenty of fluids but you should avoid alcoholic beverages for 24 hours.  ACTIVITY:  You should plan to take it easy for the rest of today and you should NOT DRIVE or use heavy machinery until tomorrow (because of the sedation medicines used during the test).    FOLLOW UP: Our staff will call the number listed on your records 48-72 hours following your procedure to check on you and address any questions or concerns that you may have regarding the information given to you following your procedure. If we do not reach you, we will leave a message.  We will attempt to reach you two times.  During this call, we will ask if you have developed any symptoms of COVID 19. If you develop any symptoms (ie: fever, flu-like symptoms, shortness of breath, cough etc.) before then, please call [phone].  If you test positive for Covid 19 in the 2 weeks post procedure, please call and report this information to [place].    If any biopsies were taken you will be contacted by phone or by letter within the next 1-3 weeks.  Please call us at [phone] if you have not heard about the biopsies in 3 weeks.    SIGNATURES/CONFIDENTIALITY:  You and/or your care partner have signed paperwork which will be entered into your electronic medical record.  These signatures attest to the fact that that the information above on your After Visit Summary has been reviewed and is understood.  Full responsibility of the confidentiality of this discharge information lies with you and/or your care-partner.

## 2019-12-11 NOTE — Progress Notes (Signed)
Called to room to assist during endoscopic procedure.  Patient ID and intended procedure confirmed with present staff. Received instructions for my participation in the procedure from the performing physician.  

## 2019-12-11 NOTE — Progress Notes (Signed)
Report given to PACU, vss 

## 2019-12-15 ENCOUNTER — Telehealth: Payer: Self-pay | Admitting: *Deleted

## 2019-12-15 NOTE — Telephone Encounter (Signed)
  Follow up Call-  Call back number 12/11/2019  Post procedure Call Back phone  # [phone]  Permission to leave phone message Yes  Some recent data might be hidden     Patient questions:  Do you have a fever, pain , or abdominal swelling? No. Pain Score  0 *  Have you tolerated food without any problems? Yes.    Have you been able to return to your normal activities? Yes.    Do you have any questions about your discharge instructions: Diet   No. Medications  No. Follow up visit  No.  Do you have questions or concerns about your Care? No.  Actions: * If pain score is 4 or above: No action needed, pain <4.  1. Have you developed a fever since your procedure? no  2.   Have you had an respiratory symptoms (SOB or cough) since your procedure? no  3.   Have you tested positive for COVID 19 since your procedure no  4.   Have you had any family members/close contacts diagnosed with the COVID 19 since your procedure?  no   If yes to any of these questions please route to Joylene John, RN and Joella Prince, RN

## 2019-12-20 ENCOUNTER — Encounter: Payer: Self-pay | Admitting: Internal Medicine

## 2019-12-20 DIAGNOSIS — Z86010 Personal history of colonic polyps: Secondary | ICD-10-CM | POA: Insufficient documentation

## 2019-12-20 DIAGNOSIS — Z860101 Personal history of adenomatous and serrated colon polyps: Secondary | ICD-10-CM

## 2019-12-20 HISTORY — DX: Personal history of colonic polyps: Z86.010

## 2019-12-20 HISTORY — DX: Personal history of adenomatous and serrated colon polyps: Z86.0101

## 2019-12-23 ENCOUNTER — Encounter: Payer: Medicaid Other | Admitting: Gastroenterology

## 2020-01-06 ENCOUNTER — Encounter: Payer: Self-pay | Admitting: Nurse Practitioner

## 2020-01-06 ENCOUNTER — Ambulatory Visit: Payer: 59 | Admitting: Nurse Practitioner

## 2020-01-06 VITALS — BP 138/80 | HR 84 | Temp 98.2°F | Ht 64.0 in | Wt 174.0 lb

## 2020-01-06 DIAGNOSIS — Z1159 Encounter for screening for other viral diseases: Secondary | ICD-10-CM | POA: Diagnosis not present

## 2020-01-06 DIAGNOSIS — Z111 Encounter for screening for respiratory tuberculosis: Secondary | ICD-10-CM | POA: Diagnosis not present

## 2020-01-06 DIAGNOSIS — Z79899 Other long term (current) drug therapy: Secondary | ICD-10-CM

## 2020-01-06 DIAGNOSIS — B353 Tinea pedis: Secondary | ICD-10-CM

## 2020-01-06 DIAGNOSIS — H6123 Impacted cerumen, bilateral: Secondary | ICD-10-CM | POA: Diagnosis not present

## 2020-01-06 DIAGNOSIS — I10 Essential (primary) hypertension: Secondary | ICD-10-CM

## 2020-01-06 DIAGNOSIS — Z23 Encounter for immunization: Secondary | ICD-10-CM

## 2020-01-06 DIAGNOSIS — E7800 Pure hypercholesterolemia, unspecified: Secondary | ICD-10-CM | POA: Diagnosis not present

## 2020-01-06 DIAGNOSIS — E559 Vitamin D deficiency, unspecified: Secondary | ICD-10-CM

## 2020-01-06 DIAGNOSIS — Z114 Encounter for screening for human immunodeficiency virus [HIV]: Secondary | ICD-10-CM | POA: Diagnosis not present

## 2020-01-06 DIAGNOSIS — Z0000 Encounter for general adult medical examination without abnormal findings: Secondary | ICD-10-CM

## 2020-01-06 LAB — POCT URINALYSIS DIPSTICK
Bilirubin, UA: NEGATIVE
Glucose, UA: NEGATIVE
Ketones, UA: NEGATIVE
Leukocytes, UA: NEGATIVE
Nitrite, UA: NEGATIVE
Protein, UA: NEGATIVE
Spec Grav, UA: 1.01 (ref 1.010–1.025)
Urobilinogen, UA: 0.2 E.U./dL
pH, UA: 5.5 (ref 5.0–8.0)

## 2020-01-06 LAB — POCT UA - MICROALBUMIN
Albumin/Creatinine Ratio, Urine, POC: 30
Creatinine, POC: 50 mg/dL
Microalbumin Ur, POC: 10 mg/L

## 2020-01-06 MED ORDER — TERBINAFINE HCL 250 MG PO TABS: 250.0000 mg | ORAL_TABLET | Freq: Every day | ORAL | 0 refills | Status: DC

## 2020-01-06 MED FILL — TERBINAFINE HCL 250 MG TAB: 250 | 90 days supply | Qty: 90 | Fill #0

## 2020-01-06 NOTE — Progress Notes (Signed)
Rutherford Nail as a scribe for [name], FNP.,have documented all relevant documentation on the behalf of [name], FNP,as directed by  [name], FNP while in the presence of [name], [place].  This visit occurred during the SARS-CoV-2 public health emergency.  Safety protocols were in place, including screening questions prior to the visit, additional usage of staff PPE, and extensive cleaning of exam room while observing appropriate contact time as indicated for disinfecting solutions.  Subjective:     Patient ID: Shelby Taylor , female    DOB: December 18, 1965 , 54 y.o.   MRN: [id number]   Chief Complaint  Patient presents with  . Annual Exam  . Hypertension    want you to look at feet     HPI  Pt presents today for her health maintenance appt Wt Readings from Last 3 Encounters: 01/06/20 : 174 lb (78.9 kg) 12/11/19 : 180 lb (81.6 kg) 11/27/19 : 180 lb (81.6 kg)  She has had a total hysterectomy with her ovaries removed in 2007.    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 The current episode started more than 1 year ago. The problem is unchanged. The problem is controlled (Fairly controlled). Pertinent negatives include no headaches.     Past Medical History:  Diagnosis Date  . Hx of adenomatous colonic polyps 12/20/2019  . Hypertension      Family History  Problem Relation Age of Onset  . Diabetes Mother   . Hypertension Mother   . Colon polyps Mother   . Diabetes Maternal Grandmother   . Hypertension Maternal Grandmother   . Prostate cancer Maternal Grandfather 82  . Cervical cancer Maternal Aunt 62       Deceased  . Colon cancer Maternal Aunt 72  . Colon cancer Other   . Esophageal cancer Neg Hx   . Rectal cancer Neg Hx   . Stomach cancer Neg Hx      Current Outpatient Medications:  .  lisinopril-hydrochlorothiazide (ZESTORETIC) 20-25 MG tablet, Take 1 tablet by mouth daily., Disp:  , Rfl:  .  Multiple Vitamin (MULTIVITAMIN) tablet, Take 1 tablet by mouth daily., Disp: , Rfl:  .  terbinafine (LAMISIL) 250 MG tablet, Take 1 tablet (250 mg total) by mouth daily., Disp: 90 tablet, Rfl: 0   Allergies  Allergen Reactions  . Morphine And Related     Itching      The patient states she is status post hysterectomy Negative for: breast discharge, breast lump(s), breast pain and breast self exam. Associated symptoms include abnormal vaginal bleeding. Pertinent negatives include abnormal bleeding (hematology), anxiety, decreased libido, depression, difficulty falling sleep, dyspareunia, history of infertility, nocturia, sexual dysfunction, sleep disturbances, urinary incontinence, urinary urgency, vaginal discharge and vaginal itching. Diet regular; "I try to eat healthy", low sodium. The patient states her exercise level is none, "I need to incorporate more exercise."    The patient's tobacco use is:  Social History   Tobacco Use  Smoking Status Never Smoker  Smokeless Tobacco Never Used   She has been exposed to passive smoke. The patient's alcohol use is:  Social History   Substance and Sexual Activity  Alcohol Use Yes  . Alcohol/week: 1.0 standard drink  . Types: 1 Glasses of wine per week   Comment: Social   Additional information: Last pap 2015 (hysterectomy)   Review of Systems  Constitutional: Negative for fatigue.  Eyes: Negative.   Respiratory: Negative.  Cardiovascular: Negative.   Endocrine: Negative for polydipsia, polyphagia and polyuria.  Musculoskeletal: Negative.   Neurological: Negative for dizziness, numbness and headaches.  Psychiatric/Behavioral: Negative.      Today's Vitals   01/06/20 1001  BP: 138/80  Pulse: 84  Temp: 98.2 F (36.8 C)  TempSrc: Oral  Weight: 174 lb (78.9 kg)  Height: 5' 4"  (1.626 m)  PainSc: 0-No pain   Body mass index is 29.87 kg/m.   Objective:  Physical Exam Constitutional:      General: She is not in  acute distress.    Appearance: Normal appearance. She is well-developed. She is obese.  HENT:     Head: Normocephalic and atraumatic.     Right Ear: Hearing, tympanic membrane, ear canal and external ear normal. There is no impacted cerumen.     Left Ear: Hearing, tympanic membrane, ear canal and external ear normal. There is no impacted cerumen.     Nose:     Comments: Deferred - masked    Mouth/Throat:     Comments: Deferred - masked Eyes:     General: Lids are normal.     Extraocular Movements: Extraocular movements intact.     Conjunctiva/sclera: Conjunctivae normal.     Pupils: Pupils are equal, round, and reactive to light.     Funduscopic exam:    Right eye: No papilledema.        Left eye: No papilledema.  Neck:     Thyroid: No thyroid mass.     Vascular: No carotid bruit.  Cardiovascular:     Rate and Rhythm: Normal rate and regular rhythm.     Pulses: Normal pulses.     Heart sounds: Normal heart sounds. No murmur heard.   Pulmonary:     Effort: Pulmonary effort is normal. No respiratory distress.     Breath sounds: Normal breath sounds. No wheezing.  Abdominal:     General: Abdomen is flat. Bowel sounds are normal. There is no distension.     Palpations: Abdomen is soft.     Tenderness: There is no abdominal tenderness.  Genitourinary:    Rectum: Guaiac result negative.  Musculoskeletal:        General: No swelling. Normal range of motion.     Cervical back: Full passive range of motion without pain, normal range of motion and neck supple.     Right lower leg: No edema.     Left lower leg: No edema.  Skin:    General: Skin is warm and dry.     Capillary Refill: Capillary refill takes less than 2 seconds.     Comments: Bilateral feet with peeling skin. Right plantar surface with large flat area with darkened center.  Right medial foot with dry indurated area with light yellow surroundings. Nailbeds are yellow and darkened.   Neurological:     General: No focal  deficit present.     Mental Status: She is alert and oriented to person, place, and time.     Cranial Nerves: No cranial nerve deficit.     Sensory: No sensory deficit.  Psychiatric:        Mood and Affect: Mood normal.        Behavior: Behavior normal.        Thought Content: Thought content normal.        Judgment: Judgment normal.         Assessment And Plan:     1. Healthcare maintenance . Behavior modifications discussed and diet history  reviewed.   . Pt will continue to exercise regularly and modify diet with low GI, plant based foods and decrease intake of processed foods.  . Recommend intake of daily multivitamin, Vitamin D, and calcium.  . Recommend mammogram (up to date) for preventive screenings, as well as recommend immunizations that include influenza, TDAP - CBC - Lipid panel  2. Encounter for hepatitis C screening test for low risk patient  Will check Hepatitis C screening due to recent recommendations to screen all adults 18 years and older - Hepatitis C antibody  3. Encounter for HIV (human immunodeficiency virus) test - HIV Antibody (routine testing w rflx)  4. Screening-pulmonary TB  Needed for school form - QuantiFERON-TB Gold Plus  5. Need for influenza vaccination  Influenza vaccine administered  Encouraged to take Tylenol as needed for fever or muscle aches. - Flu Vaccine QUAD 6+ mos PF IM (Fluarix Quad PF)  6. Essential hypertension . B/P is controlled.  . CMP ordered to check renal function.  . The importance of regular exercise and dietary modification was stressed to the patient.  . EKG done with NSR - POCT Urinalysis Dipstick (81002) - POCT UA - Microalbumin - EKG 12-Lead - CMP14+EGFR  7. Elevated cholesterol  Chronic, stable  8. Vitamin D deficiency  Will check vitamin D level and supplement as needed.     Also encouraged to spend 15 minutes in the sun daily.  - VITAMIN D 25 Hydroxy (Vit-D Deficiency, Fractures)  9.  Bilateral impacted cerumen  Firm cerumen present to bilateral ears, water irrigation done with good results  10. Tinea pedis of both feet  Bilateral feet with the right worse than left with scaling and cratered areas to feet  Will treat with terbinafine  Will check liver functions - Liver Profile; Future  11. Other long term (current) drug therapy - Liver Profile; Future     Patient was given opportunity to ask questions. Patient verbalized understanding of the plan and was able to repeat key elements of the plan. All questions were answered to their satisfaction.    Teola Bradley, FNP, have reviewed all documentation for this visit. The documentation on 01/08/20 for the exam, diagnosis, procedures, and orders are all accurate and complete.  THE PATIENT IS ENCOURAGED TO PRACTICE SOCIAL DISTANCING DUE TO THE COVID-19 PANDEMIC.

## 2020-01-06 NOTE — Patient Instructions (Signed)
Health Maintenance, Female Adopting a healthy lifestyle and getting preventive care are important in promoting health and wellness. Ask your health care provider about:  The right schedule for you to have regular tests and exams.  Things you can do on your own to prevent diseases and keep yourself healthy. What should I know about diet, weight, and exercise? Eat a healthy diet   Eat a diet that includes plenty of vegetables, fruits, low-fat dairy products, and lean protein.  Do not eat a lot of foods that are high in solid fats, added sugars, or sodium. Maintain a healthy weight Body mass index (BMI) is used to identify weight problems. It estimates body fat based on height and weight. Your health care provider can help determine your BMI and help you achieve or maintain a healthy weight. Get regular exercise Get regular exercise. This is one of the most important things you can do for your health. Most adults should:  Exercise for at least 150 minutes each week. The exercise should increase your heart rate and make you sweat (moderate-intensity exercise).  Do strengthening exercises at least twice a week. This is in addition to the moderate-intensity exercise.  Spend less time sitting. Even light physical activity can be beneficial. Watch cholesterol and blood lipids Have your blood tested for lipids and cholesterol at 54 years of age, then have this test every 5 years. Have your cholesterol levels checked more often if:  Your lipid or cholesterol levels are high.  You are older than 54 years of age.  You are at high risk for heart disease. What should I know about cancer screening? Depending on your health history and family history, you may need to have cancer screening at various ages. This may include screening for:  Breast cancer.  Cervical cancer.  Colorectal cancer.  Skin cancer.  Lung cancer. What should I know about heart disease, diabetes, and high blood  pressure? Blood pressure and heart disease  High blood pressure causes heart disease and increases the risk of stroke. This is more likely to develop in people who have high blood pressure readings, are of African descent, or are overweight.  Have your blood pressure checked: ? Every 3-5 years if you are 18-39 years of age. ? Every year if you are 40 years old or older. Diabetes Have regular diabetes screenings. This checks your fasting blood sugar level. Have the screening done:  Once every three years after age 40 if you are at a normal weight and have a low risk for diabetes.  More often and at a younger age if you are overweight or have a high risk for diabetes. What should I know about preventing infection? Hepatitis B If you have a higher risk for hepatitis B, you should be screened for this virus. Talk with your health care provider to find out if you are at risk for hepatitis B infection. Hepatitis C Testing is recommended for:  Everyone born from 1945 through 1965.  Anyone with known risk factors for hepatitis C. Sexually transmitted infections (STIs)  Get screened for STIs, including gonorrhea and chlamydia, if: ? You are sexually active and are younger than 54 years of age. ? You are older than 54 years of age and your health care provider tells you that you are at risk for this type of infection. ? Your sexual activity has changed since you were last screened, and you are at increased risk for chlamydia or gonorrhea. Ask your health care provider if   you are at risk.  Ask your health care provider about whether you are at high risk for HIV. Your health care provider may recommend a prescription medicine to help prevent HIV infection. If you choose to take medicine to prevent HIV, you should first get tested for HIV. You should then be tested every 3 months for as long as you are taking the medicine. Pregnancy  If you are about to stop having your period (premenopausal) and  you may become pregnant, seek counseling before you get pregnant.  Take 400 to 800 micrograms (mcg) of folic acid every day if you become pregnant.  Ask for birth control (contraception) if you want to prevent pregnancy. Osteoporosis and menopause Osteoporosis is a disease in which the bones lose minerals and strength with aging. This can result in bone fractures. If you are 65 years old or older, or if you are at risk for osteoporosis and fractures, ask your health care provider if you should:  Be screened for bone loss.  Take a calcium or vitamin D supplement to lower your risk of fractures.  Be given hormone replacement therapy (HRT) to treat symptoms of menopause. Follow these instructions at home: Lifestyle  Do not use any products that contain nicotine or tobacco, such as cigarettes, e-cigarettes, and chewing tobacco. If you need help quitting, ask your health care provider.  Do not use street drugs.  Do not share needles.  Ask your health care provider for help if you need support or information about quitting drugs. Alcohol use  Do not drink alcohol if: ? Your health care provider tells you not to drink. ? You are pregnant, may be pregnant, or are planning to become pregnant.  If you drink alcohol: ? Limit how much you use to 0-1 drink a day. ? Limit intake if you are breastfeeding.  Be aware of how much alcohol is in your drink. In the [place] one drink equals one 12 oz bottle of beer (355 mL), one 5 oz glass of wine (148 mL), or one 1 oz glass of hard liquor (44 mL). General instructions  Schedule regular health, dental, and eye exams.  Stay current with your vaccines.  Tell your health care provider if: ? You often feel depressed. ? You have ever been abused or do not feel safe at home. Summary  Adopting a healthy lifestyle and getting preventive care are important in promoting health and wellness.  Follow your health care provider's instructions about healthy  diet, exercising, and getting tested or screened for diseases.  Follow your health care provider's instructions on monitoring your cholesterol and blood pressure. This information is not intended to replace advice given to you by your health care provider. Make sure you discuss any questions you have with your health care provider. Document Revised: 04/09/2018 Document Reviewed: 04/09/2018 Elsevier Patient Education  2020 Else[employer] Inc.  Influenza Virus Vaccine (Flucelvax) What is this medicine? INFLUENZA VIRUS VACCINE (in floo EN zuh VAHY ruhs vak SEEN) helps to reduce the risk of getting influenza also known as the flu. The vaccine only helps protect you against some strains of the flu. This medicine may be used for other purposes; ask your health care provider or pharmacist if you have questions. COMMON BRAND NAME(S): FLUCELVAX What should I tell my health care provider before I take this medicine? They need to know if you have any of these conditions:  bleeding disorder like hemophilia  fever or infection  Guillain-Barre syndrome or other neurological problems    immune system problems  infection with the human immunodeficiency virus (HIV) or AIDS  low blood platelet counts  multiple sclerosis  an unusual or allergic reaction to influenza virus vaccine, other medicines, foods, dyes or preservatives  pregnant or trying to get pregnant  breast-feeding How should I use this medicine? This vaccine is for injection into a muscle. It is given by a health care professional. A copy of Vaccine Information Statements will be given before each vaccination. Read this sheet carefully each time. The sheet may change frequently. Talk to your pediatrician regarding the use of this medicine in children. Special care may be needed. Overdosage: If you think you've taken too much of this medicine contact a poison control center or emergency room at once. Overdosage: If you think you have taken  too much of this medicine contact a poison control center or emergency room at once. NOTE: This medicine is only for you. Do not share this medicine with others. What if I miss a dose? This does not apply. What may interact with this medicine?  chemotherapy or radiation therapy  medicines that lower your immune system like etanercept, anakinra, infliximab, and adalimumab  medicines that treat or prevent blood clots like warfarin  phenytoin  steroid medicines like prednisone or cortisone  theophylline  vaccines This list may not describe all possible interactions. Give your health care provider a list of all the medicines, herbs, non-prescription drugs, or dietary supplements you use. Also tell them if you smoke, drink alcohol, or use illegal drugs. Some items may interact with your medicine. What should I watch for while using this medicine? Report any side effects that do not go away within 3 days to your doctor or health care professional. Call your health care provider if any unusual symptoms occur within 6 weeks of receiving this vaccine. You may still catch the flu, but the illness is not usually as bad. You cannot get the flu from the vaccine. The vaccine will not protect against colds or other illnesses that may cause fever. The vaccine is needed every year. What side effects may I notice from receiving this medicine? Side effects that you should report to your doctor or health care professional as soon as possible:  allergic reactions like skin rash, itching or hives, swelling of the face, lips, or tongue Side effects that usually do not require medical attention (Report these to your doctor or health care professional if they continue or are bothersome.):  fever  headache  muscle aches and pains  pain, tenderness, redness, or swelling at the injection site  tiredness This list may not describe all possible side effects. Call your doctor for medical advice about side  effects. You may report side effects to FDA at 1-800-FDA-1088. Where should I keep my medicine? The vaccine will be given by a health care professional in a clinic, pharmacy, doctor's office, or other health care setting. You will not be given vaccine doses to store at home. NOTE: This sheet is a summary. It may not cover all possible information. If you have questions about this medicine, talk to your doctor, pharmacist, or health care provider.  2020 Elsevier/Gold Standard (2011-03-28 14:06:47)   Earwax Buildup, Adult The ears produce a substance called earwax that helps keep bacteria out of the ear and protects the skin in the ear canal. Occasionally, earwax can build up in the ear and cause discomfort or hearing loss. What increases the risk? This condition is more likely to develop in people  who:  Are female.  Are elderly.  Naturally produce more earwax.  Clean their ears often with cotton swabs.  Use earplugs often.  Use in-ear headphones often.  Wear hearing aids.  Have narrow ear canals.  Have earwax that is overly thick or sticky.  Have eczema.  Are dehydrated.  Have excess hair in the ear canal. What are the signs or symptoms? Symptoms of this condition include:  Reduced or muffled hearing.  A feeling of fullness in the ear or feeling that the ear is plugged.  Fluid coming from the ear.  Ear pain.  Ear itch.  Ringing in the ear.  Coughing.  An obvious piece of earwax that can be seen inside the ear canal. How is this diagnosed? This condition may be diagnosed based on:  Your symptoms.  Your medical history.  An ear exam. During the exam, your health care provider will look into your ear with an instrument called an otoscope. You may have tests, including a hearing test. How is this treated? This condition may be treated by:  Using ear drops to soften the earwax.  Having the earwax removed by a health care provider. The health care provider  may: ? Flush the ear with water. ? Use an instrument that has a loop on the end (curette). ? Use a suction device.  Surgery to remove the wax buildup. This may be done in severe cases. Follow these instructions at home:   Take over-the-counter and prescription medicines only as told by your health care provider.  Do not put any objects, including cotton swabs, into your ear. You can clean the opening of your ear canal with a washcloth or facial tissue.  Follow instructions from your health care provider about cleaning your ears. Do not over-clean your ears.  Drink enough fluid to keep your urine clear or pale yellow. This will help to thin the earwax.  Keep all follow-up visits as told by your health care provider. If earwax builds up in your ears often or if you use hearing aids, consider seeing your health care provider for routine, preventive ear cleanings. Ask your health care provider how often you should schedule your cleanings.  If you have hearing aids, clean them according to instructions from the manufacturer and your health care provider. Contact a health care provider if:  You have ear pain.  You develop a fever.  You have blood, pus, or other fluid coming from your ear.  You have hearing loss.  You have ringing in your ears that does not go away.  Your symptoms do not improve with treatment.  You feel like the room is spinning (vertigo). Summary  Earwax can build up in the ear and cause discomfort or hearing loss.  The most common symptoms of this condition include reduced or muffled hearing and a feeling of fullness in the ear or feeling that the ear is plugged.  This condition may be diagnosed based on your symptoms, your medical history, and an ear exam.  This condition may be treated by using ear drops to soften the earwax or by having the earwax removed by a health care provider.  Do not put any objects, including cotton swabs, into your ear. You can  clean the opening of your ear canal with a washcloth or facial tissue. This information is not intended to replace advice given to you by your health care provider. Make sure you discuss any questions you have with your health care provider. Document  Revised: 03/29/2017 Document Reviewed: 06/27/2016 Elsevier Patient Education  [place]  Athlete's foot (tinea pedis) is a fungal infection of the skin on your feet. It often occurs on the skin that is between or underneath your toes. It can also occur on the soles of your feet. Symptoms include itchy or white and flaky areas on the skin. The infection can spread from person to person (is contagious). It can also spread when a person's bare feet come in contact with the fungus on shower floors or on items such as shoes. Follow these instructions at home: Medicines  Apply or take over-the-counter and prescription medicines only as told by your doctor.  Apply your antifungal medicine as told by your doctor. Do not stop using the medicine even if your feet start to get better. Foot care  Do not scratch your feet.  Keep your feet dry: ? Wear cotton or wool socks. Change your socks every day or if they become wet. ? Wear shoes that allow air to move around, such as sandals or canvas tennis shoes.  Wash and dry your feet: ? Every day or as told by your doctor. ? After exercising. ? Including the area between your toes. General instructions  Do not share any of these items that touch your feet: ? Towels. ? Shoes. ? Nail clippers. ? Other personal items.  Protect your feet by wearing sandals in wet areas, such as locker rooms and shared showers.  Keep all follow-up visits as told by your doctor. This is important.  If you have diabetes, keep your blood sugar under control. Contact a doctor if:  You have a fever.  You have swelling, pain, warmth, or redness in your foot.  Your feet are not getting better  with treatment.  Your symptoms get worse.  You have new symptoms. Summary  Athlete's foot is a fungal infection of the skin on your feet.  Symptoms include itchy or white and flaky areas on the skin.  Apply your antifungal medicine as told by your doctor.  Keep your feet clean and dry. This information is not intended to replace advice given to you by your health care provider. Make sure you discuss any questions you have with your health care provider. Document Revised: 04/11/2017 Document Reviewed: 02/04/2017 Elsevier Patient Education  [place].

## 2020-01-08 LAB — CMP14+EGFR
ALT: 19 IU/L (ref 0–32)
AST: 27 IU/L (ref 0–40)
Albumin/Globulin Ratio: 1.6 (ref 1.2–2.2)
Albumin: 4.4 g/dL (ref 3.8–4.9)
Alkaline Phosphatase: 98 IU/L (ref 48–121)
BUN/Creatinine Ratio: 13 (ref 9–23)
BUN: 9 mg/dL (ref 6–24)
Bilirubin Total: 0.7 mg/dL (ref 0.0–1.2)
CO2: 24 mmol/L (ref 20–29)
Calcium: 9.9 mg/dL (ref 8.7–10.2)
Chloride: 98 mmol/L (ref 96–106)
Creatinine, Ser: 0.68 mg/dL (ref 0.57–1.00)
GFR calc Af Amer: 115 mL/min/{1.73_m2} (ref >59)
GFR calc non Af Amer: 99 mL/min/{1.73_m2} (ref >59)
Globulin, Total: 2.8 g/dL (ref 1.5–4.5)
Glucose: 78 mg/dL (ref 65–99)
Sodium: 141 mmol/L (ref 134–144)
Total Protein: 7.2 g/dL (ref 6.0–8.5)

## 2020-01-08 LAB — LIPID PANEL
Chol/HDL Ratio: 3.5 ratio (ref 0.0–4.4)
Cholesterol, Total: 240 mg/dL — ABNORMAL HIGH (ref 100–199)
HDL: 68 mg/dL (ref >39)
LDL Chol Calc (NIH): 162 mg/dL — ABNORMAL HIGH (ref 0–99)
Triglycerides: 61 mg/dL (ref 0–149)
VLDL Cholesterol Cal: 10 mg/dL (ref 5–40)

## 2020-01-08 LAB — QUANTIFERON-TB GOLD PLUS
QuantiFERON Mitogen Value: >10 IU/mL
QuantiFERON Nil Value: 0.01 IU/mL
QuantiFERON TB1 Ag Value: 0.05 IU/mL
QuantiFERON TB2 Ag Value: 0.04 IU/mL
QuantiFERON-TB Gold Plus: NEGATIVE

## 2020-01-08 LAB — HIV ANTIBODY (ROUTINE TESTING W REFLEX): HIV Screen 4th Generation wRfx: NONREACTIVE

## 2020-01-08 LAB — HEPATITIS C ANTIBODY: Hep C Virus Ab: 0.1 s/co ratio (ref 0.0–0.9)

## 2020-01-08 LAB — CBC
Hematocrit: 39.6 % (ref 34.0–46.6)
Hemoglobin: 12.4 g/dL (ref 11.1–15.9)
MCH: 25.9 pg — ABNORMAL LOW (ref 26.6–33.0)
MCHC: 31.3 g/dL — ABNORMAL LOW (ref 31.5–35.7)
MCV: 83 fL (ref 79–97)
Platelets: 247 10*3/uL (ref 150–450)
RBC: 4.79 x10E6/uL (ref 3.77–5.28)
RDW: 12.7 % (ref 11.7–15.4)
WBC: 4.8 10*3/uL (ref 3.4–10.8)

## 2020-01-08 LAB — VITAMIN D 25 HYDROXY (VIT D DEFICIENCY, FRACTURES): Vit D, 25-Hydroxy: 19.5 ng/mL — ABNORMAL LOW (ref 30.0–100.0)

## 2020-01-11 DIAGNOSIS — H0102A Squamous blepharitis right eye, upper and lower eyelids: Secondary | ICD-10-CM | POA: Diagnosis not present

## 2020-01-11 DIAGNOSIS — H0102B Squamous blepharitis left eye, upper and lower eyelids: Secondary | ICD-10-CM | POA: Diagnosis not present

## 2020-01-11 DIAGNOSIS — H40013 Open angle with borderline findings, low risk, bilateral: Secondary | ICD-10-CM | POA: Diagnosis not present

## 2020-01-19 ENCOUNTER — Encounter: Payer: Self-pay | Admitting: Nurse Practitioner

## 2020-01-19 ENCOUNTER — Other Ambulatory Visit: Payer: Self-pay

## 2020-01-19 MED ORDER — LISINOPRIL-HYDROCHLOROTHIAZIDE 20-25 MG PO TABS: 1.0000 | ORAL_TABLET | Freq: Every day | ORAL | 1 refills | Status: DC

## 2020-01-19 MED FILL — LISINOPRIL-HCTZ 20-25 MG TA: 20-25 | 90 days supply | Qty: 90 | Fill #0

## 2020-02-16 ENCOUNTER — Other Ambulatory Visit: Payer: 59

## 2020-02-16 ENCOUNTER — Other Ambulatory Visit: Payer: Self-pay

## 2020-02-16 DIAGNOSIS — B353 Tinea pedis: Secondary | ICD-10-CM | POA: Diagnosis not present

## 2020-02-16 DIAGNOSIS — Z79899 Other long term (current) drug therapy: Secondary | ICD-10-CM

## 2020-02-17 LAB — HEPATIC FUNCTION PANEL
ALT: 21 IU/L (ref 0–32)
AST: 16 IU/L (ref 0–40)
Albumin: 4.4 g/dL (ref 3.8–4.9)
Alkaline Phosphatase: 94 IU/L (ref 44–121)
Bilirubin Total: 0.4 mg/dL (ref 0.0–1.2)
Bilirubin, Direct: 0.11 mg/dL (ref 0.00–0.40)
Total Protein: 7 g/dL (ref 6.0–8.5)

## 2020-04-05 ENCOUNTER — Ambulatory Visit: Payer: 59 | Admitting: Nurse Practitioner

## 2020-04-05 ENCOUNTER — Encounter: Payer: Self-pay | Admitting: Nurse Practitioner

## 2020-04-05 ENCOUNTER — Other Ambulatory Visit: Payer: Self-pay

## 2020-04-05 ENCOUNTER — Other Ambulatory Visit: Payer: Self-pay | Admitting: Nurse Practitioner

## 2020-04-05 VITALS — BP 122/74 | HR 94 | Temp 98.2°F | Wt 178.0 lb

## 2020-04-05 DIAGNOSIS — Z79899 Other long term (current) drug therapy: Secondary | ICD-10-CM | POA: Diagnosis not present

## 2020-04-05 DIAGNOSIS — Z6830 Body mass index (BMI) 30.0-30.9, adult: Secondary | ICD-10-CM | POA: Diagnosis not present

## 2020-04-05 DIAGNOSIS — B353 Tinea pedis: Secondary | ICD-10-CM | POA: Diagnosis not present

## 2020-04-05 MED ORDER — NYSTATIN 100000 UNIT/GM EX POWD: 1.0000 "application " | Freq: Three times a day (TID) | CUTANEOUS | 0 refills | Status: DC

## 2020-04-05 MED ORDER — LISINOPRIL-HYDROCHLOROTHIAZIDE 20-25 MG PO TABS
1.0000 | ORAL_TABLET | Freq: Every day | ORAL | 1 refills | Status: DC
Start: 2020-04-05 — End: 2020-04-05

## 2020-04-05 MED ORDER — TERBINAFINE HCL 250 MG PO TABS: 250.0000 mg | ORAL_TABLET | Freq: Every day | ORAL | 0 refills | Status: DC

## 2020-04-05 MED FILL — NYSTATIN 100,000 UNIT/GM PO: 100000 | 5 days supply | Qty: 15 | Fill #0

## 2020-04-05 MED FILL — LISINOPRIL-HCTZ 20-25 MG TA: 20-25 | 90 days supply | Qty: 90 | Fill #0

## 2020-04-05 MED FILL — TERBINAFINE HCL 250 MG TAB: 250 | 90 days supply | Qty: 90 | Fill #0

## 2020-04-05 NOTE — Progress Notes (Signed)
Rutherford Nail as a scribe for [name], FNP.,have documented all relevant documentation on the behalf of [name], FNP,as directed by  [name], FNP while in the presence of [name], Hydro. This visit occurred during the SARS-CoV-2 public health emergency.  Safety protocols were in place, including screening questions prior to the visit, additional usage of staff PPE, and extensive cleaning of exam room while observing appropriate contact time as indicated for disinfecting solutions.  Subjective:     Patient ID: Shelby Taylor , female    DOB: Mar 26, 1966 , 54 y.o.   MRN: [id number]   Chief Complaint  Patient presents with  . Tinea Pedis    HPI  Here for recheck follow up for tinea pedis.  She has been using a home remedy, she also treated her shoes.  She now has areas between her toes. She is tolerating the medication well. She has been using miconazole powder and baking soda in her socks when working long hours.      Past Medical History:  Diagnosis Date  . Hx of adenomatous colonic polyps 12/20/2019  . Hypertension      Family History  Problem Relation Age of Onset  . Diabetes Mother   . Hypertension Mother   . Colon polyps Mother   . Diabetes Maternal Grandmother   . Hypertension Maternal Grandmother   . Prostate cancer Maternal Grandfather 54  . Cervical cancer Maternal Aunt 30       Deceased  . Colon cancer Maternal Aunt 72  . Colon cancer Other   . Esophageal cancer Neg Hx   . Rectal cancer Neg Hx   . Stomach cancer Neg Hx      Current Outpatient Medications:  .  lisinopril-hydrochlorothiazide (ZESTORETIC) 20-25 MG tablet, Take 1 tablet by mouth daily., Disp: 90 tablet, Rfl: 1 .  Multiple Vitamin (MULTIVITAMIN) tablet, Take 1 tablet by mouth daily., Disp: , Rfl:  .  terbinafine (LAMISIL) 250 MG tablet, Take 1 tablet (250 mg total) by mouth daily., Disp: 90 tablet, Rfl: 0 .  nystatin (NYSTATIN) powder, Apply 1 application topically 3  (three) times daily., Disp: 15 g, Rfl: 0   Allergies  Allergen Reactions  . Morphine And Related     Itching     Review of Systems  Constitutional: Negative.  Negative for fatigue.  HENT: Negative.   Respiratory: Negative.   Cardiovascular: Negative.   Endocrine: Negative for polydipsia, polyphagia and polyuria.  Musculoskeletal: Negative.   Skin: Negative.   Neurological: Negative for dizziness and headaches.  Psychiatric/Behavioral: Negative.      Today's Vitals   04/05/20 1158  BP: 122/74  Pulse: 94  Temp: 98.2 F (36.8 C)  TempSrc: Oral  Weight: 178 lb (80.7 kg)   Body mass index is 30.55 kg/m.  Wt Readings from Last 3 Encounters:  04/05/20 178 lb (80.7 kg)  01/06/20 174 lb (78.9 kg)  12/11/19 180 lb (81.6 kg)   Objective:  Physical Exam Skin:    Comments: Bilateral feet are improved with peeling skin, she does have some areas between her toes. The yellow discoloration to her toenails is improving.    Neurological:     General: No focal deficit present.     Mental Status: She is oriented to person, place, and time.     Cranial Nerves: No cranial nerve deficit.  Psychiatric:        Mood and Affect: Mood normal.        Behavior: Behavior normal.  Thought Content: Thought content normal.        Judgment: Judgment normal.         Assessment And Plan:     1. Tinea pedis of both feet  She is going well and her feet are improving  Will refill terbinafine  Liver functions were normal at last visit - terbinafine (LAMISIL) 250 MG tablet; Take 1 tablet (250 mg total) by mouth daily.  Dispense: 90 tablet; Refill: 0 - nystatin (NYSTATIN) powder; Apply 1 application topically 3 (three) times daily.  Dispense: 15 g; Refill: 0 - Liver Profile  2. Other long term (current) drug therapy - Liver Profile  3. BMI 30.0-30.9,adult  She is encouraged to strive for BMI less than 30 to decrease cardiac risk. Advised to aim for at least 150 minutes of exercise  per week.     Patient was given opportunity to ask questions. Patient verbalized understanding of the plan and was able to repeat key elements of the plan. All questions were answered to their satisfaction.   Teola Bradley, FNP, have reviewed all documentation for this visit. The documentation on 04/05/20 for the exam, diagnosis, procedures, and orders are all accurate and complete.   THE PATIENT IS ENCOURAGED TO PRACTICE SOCIAL DISTANCING DUE TO THE COVID-19 PANDEMIC.

## 2020-04-06 LAB — HEPATIC FUNCTION PANEL
ALT: 21 IU/L (ref 0–32)
AST: 17 IU/L (ref 0–40)
Albumin: 4.1 g/dL (ref 3.8–4.9)
Alkaline Phosphatase: 85 IU/L (ref 44–121)
Bilirubin Total: 0.3 mg/dL (ref 0.0–1.2)
Bilirubin, Direct: 0.1 mg/dL (ref 0.00–0.40)
Total Protein: 6.7 g/dL (ref 6.0–8.5)

## 2020-06-07 ENCOUNTER — Other Ambulatory Visit: Payer: Self-pay | Admitting: Internal Medicine

## 2020-06-07 ENCOUNTER — Ambulatory Visit: Payer: 59 | Attending: Internal Medicine

## 2020-06-07 DIAGNOSIS — Z23 Encounter for immunization: Secondary | ICD-10-CM

## 2020-06-07 NOTE — Progress Notes (Signed)
   Covid-19 Vaccination Clinic  Name:  Shelby Taylor    MRN: [id number] DOB: Sep 02, 1965  06/07/2020  [name] was observed post Covid-19 immunization for 15 minutes without incident. She was provided with Vaccine Information Sheet and instruction to access the V-Safe system.   [name] was instructed to call 911 with any severe reactions post vaccine: Marland Kitchen Difficulty breathing  . Swelling of face and throat  . A fast heartbeat  . A bad rash all over body  . Dizziness and weakness   Immunizations Administered    Name Date Dose VIS Date Route   PFIZER Comrnaty(Gray TOP) Covid-19 Vaccine 06/07/2020  9:14 AM 0.3 mL 04/07/2020 Intramuscular   Manufacturer: [place]   Lot: [id number]   NDC: [phone]

## 2020-07-04 ENCOUNTER — Other Ambulatory Visit: Payer: Self-pay

## 2020-07-04 ENCOUNTER — Encounter: Payer: Self-pay | Admitting: Nurse Practitioner

## 2020-07-04 ENCOUNTER — Ambulatory Visit: Payer: 59 | Admitting: Nurse Practitioner

## 2020-07-04 VITALS — BP 118/80 | HR 102 | Temp 98.3°F | Ht 63.0 in | Wt 176.0 lb

## 2020-07-04 DIAGNOSIS — Z8616 Personal history of COVID-19: Secondary | ICD-10-CM

## 2020-07-04 DIAGNOSIS — B353 Tinea pedis: Secondary | ICD-10-CM

## 2020-07-04 MED ORDER — TERBINAFINE HCL 250 MG PO TABS: 250.0000 mg | ORAL_TABLET | Freq: Every day | ORAL | 0 refills | Status: DC

## 2020-07-04 NOTE — Progress Notes (Signed)
I,Yamilka Roman [employer] as a Education administrator for [employer], FNP.,have documented all relevant documentation on the behalf of [name], FNP,as directed by  [name], FNP while in the presence of [name], [place]. This visit occurred during the SARS-CoV-2 public health emergency.  Safety protocols were in place, including screening questions prior to the visit, additional usage of staff PPE, and extensive cleaning of exam room while observing appropriate contact time as indicated for disinfecting solutions.  Subjective:     Patient ID: Shelby Taylor , female    DOB: [birth year] , 55 y.o.   MRN: [id number]   Chief Complaint  Patient presents with  . tinea pedis f/u    Patient stated her feet are starting to improve.     HPI  Here for follow up tinea pedis, she is taking terbinafine without any issues. She does feel her feet are improving.   She had Covid 19 in January 2022, she is fully vaccinated and now having a booster. She had a sorethroat and headache, lost sense of taste lasting a couple days. She then had a cough for about one week.     Past Medical History:  Diagnosis Date  . Hx of adenomatous colonic polyps 12/20/2019  . Hypertension      Family History  Problem Relation Age of Onset  . Diabetes Mother   . Hypertension Mother   . Colon polyps Mother   . Diabetes Maternal Grandmother   . Hypertension Maternal Grandmother   . Prostate cancer Maternal Grandfather 14  . Cervical cancer Maternal Aunt 16       Deceased  . Colon cancer Maternal Aunt 30  . Colon cancer Other   . Esophageal cancer Neg Hx   . Rectal cancer Neg Hx   . Stomach cancer Neg Hx      Current Outpatient Medications:  .  lisinopril-hydrochlorothiazide (ZESTORETIC) 20-25 MG tablet, Take 1 tablet by mouth daily., Disp: 90 tablet, Rfl: 1 .  Multiple Vitamin (MULTIVITAMIN) tablet, Take 1 tablet by mouth daily., Disp: , Rfl:  .  nystatin (NYSTATIN) powder, Apply 1 application topically 3  (three) times daily., Disp: 15 g, Rfl: 0 .  terbinafine (LAMISIL) 250 MG tablet, Take 1 tablet (250 mg total) by mouth daily., Disp: 90 tablet, Rfl: 0   Allergies  Allergen Reactions  . Morphine And Related     Itching     Review of Systems  Constitutional: Negative.  Negative for fatigue.  HENT: Negative.   Respiratory: Negative.   Cardiovascular: Negative.  Negative for chest pain, palpitations and leg swelling.  Endocrine: Negative for polydipsia, polyphagia and polyuria.  Musculoskeletal: Negative.   Skin: Negative.   Neurological: Negative for dizziness and headaches.  Psychiatric/Behavioral: Negative.      Today's Vitals   07/04/20 0850  BP: 118/80  Pulse: (!) 102  Temp: 98.3 F (36.8 C)  TempSrc: Oral  Weight: 176 lb (79.8 kg)  Height: _0  (1.6 m)  PainSc: 0-No pain   Body mass index is 31.18 kg/m.   Objective:  Physical Exam Constitutional:      General: She is not in acute distress.    Appearance: Normal appearance.  Cardiovascular:     Rate and Rhythm: Normal rate.     Pulses: Normal pulses.     Heart sounds: Normal heart sounds. No murmur heard.   Pulmonary:     Effort: Pulmonary effort is normal. No respiratory distress.     Breath sounds: Normal breath sounds.  Skin:  General: Skin is warm.     Findings: Rash (improved peeling to her feet.) present.     Comments: Bilateral feet are improved with peeling skin, she does have some areas between her toes. The yellow discoloration to her toenails is improving.    Neurological:     General: No focal deficit present.     Mental Status: She is alert and oriented to person, place, and time.     Cranial Nerves: No cranial nerve deficit.  Psychiatric:        Mood and Affect: Mood normal.        Behavior: Behavior normal.        Thought Content: Thought content normal.        Judgment: Judgment normal.         Assessment And Plan:     1. Tinea pedis of both feet  She has had some improvement  with her feet, she does have peeling skin to her right heel with slight yellow color.   She is advised to use vaseline to her feet after getting out of the shower - CMP14+EGFR - terbinafine (LAMISIL) 250 MG tablet; Take 1 tablet (250 mg total) by mouth daily.  Dispense: 90 tablet; Refill: 0  2. History of COVID-19  She had covid earlier in January and is doing much better     Patient was given opportunity to ask questions. Patient verbalized understanding of the plan and was able to repeat key elements of the plan. All questions were answered to their satisfaction.  [name], FNP   I, [name], FNP, have reviewed all documentation for this visit. The documentation on 07/04/20 for the exam, diagnosis, procedures, and orders are all accurate and complete.   THE PATIENT IS ENCOURAGED TO PRACTICE SOCIAL DISTANCING DUE TO THE COVID-19 PANDEMIC.

## 2020-07-05 LAB — CMP14+EGFR
ALT: 20 IU/L (ref 0–32)
AST: 16 IU/L (ref 0–40)
Albumin/Globulin Ratio: 1.8 (ref 1.2–2.2)
Albumin: 4.2 g/dL (ref 3.8–4.9)
Alkaline Phosphatase: 90 IU/L (ref 44–121)
BUN/Creatinine Ratio: 18 (ref 9–23)
BUN: 12 mg/dL (ref 6–24)
Bilirubin Total: 0.4 mg/dL (ref 0.0–1.2)
CO2: 22 mmol/L (ref 20–29)
Calcium: 9.5 mg/dL (ref 8.7–10.2)
Chloride: 104 mmol/L (ref 96–106)
Creatinine, Ser: 0.68 mg/dL (ref 0.57–1.00)
Globulin, Total: 2.4 g/dL (ref 1.5–4.5)
Glucose: 109 mg/dL — ABNORMAL HIGH (ref 65–99)
Potassium: 4.1 mmol/L (ref 3.5–5.2)
Sodium: 145 mmol/L — ABNORMAL HIGH (ref 134–144)
Total Protein: 6.6 g/dL (ref 6.0–8.5)
eGFR: 103 mL/min/{1.73_m2} (ref >59)

## 2020-07-08 ENCOUNTER — Telehealth: Payer: Self-pay

## 2020-07-08 NOTE — Telephone Encounter (Signed)
-----   Message from [name], [place] sent at 07/07/2020 11:41 PM EST ----- Your kidney functions are normal however you need to drink more water. Liver functions are normal.

## 2020-07-08 NOTE — Telephone Encounter (Signed)
Left the patient a message to call back for lab results. 

## 2020-10-04 ENCOUNTER — Ambulatory Visit: Payer: 59 | Admitting: Nurse Practitioner

## 2021-01-10 ENCOUNTER — Encounter: Payer: 59 | Admitting: Nurse Practitioner

## 2021-04-25 ENCOUNTER — Other Ambulatory Visit: Payer: Self-pay

## 2021-04-25 ENCOUNTER — Ambulatory Visit: Payer: 59 | Admitting: Nurse Practitioner

## 2021-04-25 VITALS — BP 136/82 | HR 62 | Temp 98.7°F | Ht 63.0 in | Wt 182.0 lb

## 2021-04-25 DIAGNOSIS — Z6832 Body mass index (BMI) 32.0-32.9, adult: Secondary | ICD-10-CM | POA: Diagnosis not present

## 2021-04-25 DIAGNOSIS — E6609 Other obesity due to excess calories: Secondary | ICD-10-CM | POA: Diagnosis not present

## 2021-04-25 DIAGNOSIS — Z111 Encounter for screening for respiratory tuberculosis: Secondary | ICD-10-CM | POA: Diagnosis not present

## 2021-04-25 DIAGNOSIS — L989 Disorder of the skin and subcutaneous tissue, unspecified: Secondary | ICD-10-CM

## 2021-04-25 NOTE — Progress Notes (Signed)
I,Chidubem Chaires,acting as a Education administrator for [employer], FNP.,have documented all relevant documentation on the behalf of [name], FNP,as directed by  [name], FNP while in the presence of [name], [place].  This visit occurred during the SARS-CoV-2 public health emergency.  Safety protocols were in place, including screening questions prior to the visit, additional usage of staff PPE, and extensive cleaning of exam room while observing appropriate contact time as indicated for disinfecting solutions.  Subjective:     Patient ID: Shelby Taylor , female    DOB: [birth year] , 55 y.o.   MRN: [id number]   Chief Complaint  Patient presents with   Mass    HPI  Patient is here for evaluation of bump on her neck noticed in the Summer of 2022, occasionally will have a black head. Has been exfoliating and has not improved. Denies pain. Was getting bigger in size.     Past Medical History:  Diagnosis Date   Hx of adenomatous colonic polyps 12/20/2019   Hypertension      Family History  Problem Relation Age of Onset   Diabetes Mother    Hypertension Mother    Colon polyps Mother    Diabetes Maternal Grandmother    Hypertension Maternal Grandmother    Prostate cancer Maternal Grandfather 72   Cervical cancer Maternal Aunt 40       Deceased   Colon cancer Maternal Aunt 60   Colon cancer Other    Esophageal cancer Neg Hx    Rectal cancer Neg Hx    Stomach cancer Neg Hx      Current Outpatient Medications:    COVID-19 mRNA Vac-TriS, Pfizer, SUSP injection, USE AS DIRECTED, Disp: .3 mL, Rfl: 0   lisinopril-hydrochlorothiazide (ZESTORETIC) 20-25 MG tablet, TAKE 1 TABLET BY MOUTH ONCE DAILY., Disp: 90 tablet, Rfl: 1   Multiple Vitamin (MULTIVITAMIN) tablet, Take 1 tablet by mouth daily., Disp: , Rfl:    terbinafine (LAMISIL) 250 MG tablet, Take 1 tablet (250 mg total) by mouth daily., Disp: 90 tablet, Rfl: 0   Allergies  Allergen Reactions   Morphine And  Related     Itching     Review of Systems  Constitutional: Negative.   Respiratory: Negative.    Cardiovascular: Negative.   Gastrointestinal: Negative.   Skin:  Positive for rash.  Neurological: Negative.     Today's Vitals   04/25/21 1147  BP: 136/82  Temp: 98.7 F (37.1 C)  TempSrc: Oral  Weight: 182 lb (82.6 kg)  Height: 5\' 3"  (1.6 m)   Body mass index is 32.24 kg/m.  Wt Readings from Last 3 Encounters:  04/25/21 182 lb (82.6 kg)  07/04/20 176 lb (79.8 kg)  04/05/20 178 lb (80.7 kg)    Objective:  Physical Exam      Assessment And Plan:     1. Bumps on skin  2. Class 1 obesity due to excess calories with serious comorbidity and body mass index (BMI) of 32.0 to 32.9 in adult     Patient was given opportunity to ask questions. Patient verbalized understanding of the plan and was able to repeat key elements of the plan. All questions were answered to their satisfaction.  Octavio Manns   I, Octavio Manns, have reviewed all documentation for this visit. The documentation on 04/25/21 for the exam, diagnosis, procedures, and orders are all accurate and complete.   IF YOU HAVE BEEN REFERRED TO A SPECIALIST, IT MAY TAKE 1-2 WEEKS TO SCHEDULE/PROCESS THE REFERRAL. IF YOU  HAVE NOT HEARD FROM US/SPECIALIST IN TWO WEEKS, PLEASE GIVE [place] A CALL AT [phone].   THE PATIENT IS ENCOURAGED TO PRACTICE SOCIAL DISTANCING DUE TO THE COVID-19 PANDEMIC.

## 2021-04-25 NOTE — Progress Notes (Signed)
This visit occurred during the SARS-CoV-2 public health emergency.  Safety protocols were in place, including screening questions prior to the visit, additional usage of staff PPE, and extensive cleaning of exam room while observing appropriate contact time as indicated for disinfecting solutions.  Subjective:     Patient ID: Shelby Taylor , female    DOB: [birth year] , 55 y.o.   MRN: [id number]   Chief Complaint  Patient presents with   Mass    HPI  Has an area to posterior neck non tender.     Past Medical History:  Diagnosis Date   Hx of adenomatous colonic polyps 12/20/2019   Hypertension      Family History  Problem Relation Age of Onset   Diabetes Mother    Hypertension Mother    Colon polyps Mother    Diabetes Maternal Grandmother    Hypertension Maternal Grandmother    Prostate cancer Maternal Grandfather 97   Cervical cancer Maternal Aunt 40       Deceased   Colon cancer Maternal Aunt 7   Colon cancer Other    Esophageal cancer Neg Hx    Rectal cancer Neg Hx    Stomach cancer Neg Hx      Current Outpatient Medications:    COVID-19 mRNA Vac-TriS, Pfizer, SUSP injection, USE AS DIRECTED, Disp: .3 mL, Rfl: 0   lisinopril-hydrochlorothiazide (ZESTORETIC) 20-25 MG tablet, TAKE 1 TABLET BY MOUTH ONCE DAILY., Disp: 90 tablet, Rfl: 1   Multiple Vitamin (MULTIVITAMIN) tablet, Take 1 tablet by mouth daily., Disp: , Rfl:    terbinafine (LAMISIL) 250 MG tablet, Take 1 tablet (250 mg total) by mouth daily., Disp: 90 tablet, Rfl: 0   Allergies  Allergen Reactions   Morphine And Related     Itching     Review of Systems  Constitutional: Negative.  Negative for fatigue.  HENT: Negative.    Respiratory: Negative.    Cardiovascular: Negative.  Negative for chest pain, palpitations and leg swelling.  Endocrine: Negative for polydipsia, polyphagia and polyuria.  Musculoskeletal: Negative.   Skin:  Positive for rash.  Neurological:  Negative for dizziness and  headaches.  Psychiatric/Behavioral: Negative.      Today's Vitals   04/25/21 1147  BP: 136/82  Pulse: 62  Temp: 98.7 F (37.1 C)  TempSrc: Oral  Weight: 182 lb (82.6 kg)  Height: 5\' 3"  (1.6 m)   Body mass index is 32.24 kg/m.   Objective:  Physical Exam Vitals reviewed.  Constitutional:      General: She is not in acute distress.    Appearance: Normal appearance.  Cardiovascular:     Rate and Rhythm: Normal rate.     Pulses: Normal pulses.     Heart sounds: Normal heart sounds. No murmur heard. Pulmonary:     Effort: Pulmonary effort is normal. No respiratory distress.     Breath sounds: Normal breath sounds.  Skin:    General: Skin is warm.     Findings: Rash (scattered rash to skin) present.  Neurological:     General: No focal deficit present.     Mental Status: She is alert and oriented to person, place, and time.     Cranial Nerves: No cranial nerve deficit.  Psychiatric:        Mood and Affect: Mood normal.        Behavior: Behavior normal.        Thought Content: Thought content normal.        Judgment: Judgment normal.  Assessment And Plan:     1. Bumps on skin - Ambulatory referral to Dermatology  2. Class 1 obesity due to excess calories with serious comorbidity and body mass index (BMI) of 32.0 to 32.9 in adult Chronic Discussed healthy diet and regular exercise options  Encouraged to exercise at least 150 minutes per week with 2 days of strength training  3. Screening for tuberculosis - TB Skin Test     Patient was given opportunity to ask questions. Patient verbalized understanding of the plan and was able to repeat key elements of the plan. All questions were answered to their satisfaction.  [name], FNP   I, [name], FNP, have reviewed all documentation for this visit. The documentation on 04/25/21 for the exam, diagnosis, procedures, and orders are all accurate and complete.   IF YOU HAVE BEEN REFERRED TO A SPECIALIST, IT  MAY TAKE 1-2 WEEKS TO SCHEDULE/PROCESS THE REFERRAL. IF YOU HAVE NOT HEARD FROM US/SPECIALIST IN TWO WEEKS, PLEASE GIVE [place] A CALL AT [phone].   THE PATIENT IS ENCOURAGED TO PRACTICE SOCIAL DISTANCING DUE TO THE COVID-19 PANDEMIC.

## 2021-04-25 NOTE — Patient Instructions (Addendum)
Zoster Vaccine, Recombinant injection What is this medication? ZOSTER VACCINE (ZOS ter vak SEEN) is a vaccine used to reduce the risk of getting shingles. This vaccine is not used to treat shingles or nerve pain from shingles. This medicine may be used for other purposes; ask your health care provider or pharmacist if you have questions. COMMON BRAND NAME(S): [facility] What should I tell my care team before I take this medication? They need to know if you have any of these conditions: cancer immune system problems an unusual or allergic reaction to Zoster vaccine, other medications, foods, dyes, or preservatives pregnant or trying to get pregnant breast-feeding How should I use this medication? This vaccine is injected into a muscle. It is given by a health care provider. A copy of Vaccine Information Statements will be given before each vaccination. Be sure to read this information carefully each time. This sheet may change often. Talk to your health care provider about the use of this vaccine in children. This vaccine is not approved for use in children. Overdosage: If you think you have taken too much of this medicine contact a poison control center or emergency room at once. NOTE: This medicine is only for you. Do not share this medicine with others. What if I miss a dose? Keep appointments for follow-up (booster) doses. It is important not to miss your dose. Call your health care provider if you are unable to keep an appointment. What may interact with this medication? medicines that suppress your immune system medicines to treat cancer steroid medicines like prednisone or cortisone This list may not describe all possible interactions. Give your health care provider a list of all the medicines, herbs, non-prescription drugs, or dietary supplements you use. Also tell them if you smoke, drink alcohol, or use illegal drugs. Some items may interact with your medicine. What should I watch for  while using this medication? Visit your health care provider regularly. This vaccine, like all vaccines, may not fully protect everyone. What side effects may I notice from receiving this medication? Side effects that you should report to your doctor or health care professional as soon as possible: allergic reactions (skin rash, itching or hives; swelling of the face, lips, or tongue) trouble breathing Side effects that usually do not require medical attention (report these to your doctor or health care professional if they continue or are bothersome): chills headache fever nausea pain, redness, or irritation at site where injected tiredness vomiting This list may not describe all possible side effects. Call your doctor for medical advice about side effects. You may report side effects to FDA at 1-800-FDA-1088. Where should I keep my medication? This vaccine is only given by a health care provider. It will not be stored at home. NOTE: This sheet is a summary. It may not cover all possible information. If you have questions about this medicine, talk to your doctor, pharmacist, or health care provider.  2022 Elsevier/Gold Standard (2021-01-03 00:00:00)

## 2021-04-27 LAB — TB SKIN TEST
Induration: 0 mm
TB Skin Test: NEGATIVE

## 2021-05-02 ENCOUNTER — Ambulatory Visit: Payer: Self-pay | Admitting: Nurse Practitioner

## 2021-05-07 ENCOUNTER — Encounter: Payer: Self-pay | Admitting: Nurse Practitioner

## 2021-05-18 ENCOUNTER — Telehealth: Payer: Self-pay | Admitting: Dermatology

## 2021-05-18 NOTE — Telephone Encounter (Signed)
Patient is calling for a referral appointment from [name], [place].  Patient is scheduled for 12/06/2021 at 8:00 with Lavonna Monarch, M.D.

## 2021-05-22 NOTE — Telephone Encounter (Signed)
Referral opened back up and attached to appointment.

## 2021-07-01 ENCOUNTER — Ambulatory Visit (HOSPITAL_COMMUNITY)
Admission: EM | Admit: 2021-07-01 | Discharge: 2021-07-01 | Disposition: A | Discharge status: Home / Self Care | Payer: 59 | Attending: Nurse Practitioner | Admitting: Nurse Practitioner

## 2021-07-01 ENCOUNTER — Other Ambulatory Visit: Payer: Self-pay

## 2021-07-01 ENCOUNTER — Encounter (HOSPITAL_COMMUNITY): Payer: Self-pay | Admitting: Emergency Medicine

## 2021-07-01 DIAGNOSIS — R21 Rash and other nonspecific skin eruption: Secondary | ICD-10-CM

## 2021-07-01 MED ORDER — VALACYCLOVIR HCL 1 G PO TABS: 1000.0000 mg | ORAL_TABLET | Freq: Three times a day (TID) | ORAL | 0 refills | Status: AC

## 2021-07-01 MED ORDER — PREDNISONE 20 MG PO TABS: 20.0000 mg | ORAL_TABLET | Freq: Every day | ORAL | 0 refills | Status: AC

## 2021-07-01 NOTE — ED Provider Notes (Signed)
?[facility] ? ? ? ?CSN: [id number] ?Arrival date & time: 07/01/21  1724 ? ? ?  ? ?History   ?Chief Complaint ?Chief Complaint  ?Patient presents with  ? Rash  ? Muscle Pain  ? ? ?HPI ?Shelby Taylor is a 56 y.o. female.  ? ?Patient is a 56 year old female who presents for rash over the past 1 to 2 days.  Patient states that she first developed pain in the right shoulder.  She described the pain as aching.  The next day she noticed a rash located in the middle of her back.  She states it was like for "knots"..  The next day she noticed a rash under the right breast.  The rash under the right breast is painful, burning, and itching.  She states that the symptoms are worse immediately under the breast, the portion not exposed to air.  She has tried over-the-counter topicals and states symptoms have improved.  She states that initially she thought she had eaten something that she was allergic to, but noticed that her symptoms were not improving.  The patient does not recall any contact with anyone with rash, she denies any new detergents, lotions, soaps, or medications. ? ? ?Rash ?Muscle Pain ? ? ?Past Medical History:  ?Diagnosis Date  ? Hx of adenomatous colonic polyps 12/20/2019  ? Hypertension   ? ? ?Patient Active Problem List  ? Diagnosis Date Noted  ? Hx of adenomatous colonic polyps 12/20/2019  ? Healthcare maintenance 08/26/2017  ? Lack of immunity to hepatitis B virus demonstrated by serologic test 04/10/2017  ? Hypertension 04/03/2017  ? Abnormal thyroid stimulating hormone (TSH) level 04/03/2017  ? UTERINE FIBROID 06/27/2006  ? ? ?Past Surgical History:  ?Procedure Laterality Date  ? COLONOSCOPY W/ POLYPECTOMY  11/2019  ? TOTAL ABDOMINAL HYSTERECTOMY W/ BILATERAL SALPINGOOPHORECTOMY  2007  ? Hx of Fibroids, ovaries removed 2/2 cysts and adhesions to uterus; no hx of abnormal pap smear  ? ? ?OB History   ? ? Gravida  ?1  ? Para  ?0  ? Term  ?0  ? Preterm  ?0  ? AB  ?1  ? Living  ?0  ?  ? ? SAB  ?0   ? IAB  ?0  ? Ectopic  ?   ? Multiple  ?   ? Live Births  ?0  ?   ?  ?  ? ? ? ?Home Medications   ? ?Prior to Admission medications   ?Medication Sig Start Date End Date Taking? Authorizing Provider  ?predniSONE (DELTASONE) 20 MG tablet Take 1 tablet (20 mg total) by mouth daily with breakfast for 7 days. 07/01/21 07/08/21 Yes Tin Engram-Warren, Alda Lea, NP  ?valACYclovir (VALTREX) 1000 MG tablet Take 1 tablet (1,000 mg total) by mouth 3 (three) times daily for 7 days. 07/01/21 07/08/21 Yes Garritt Molyneux-Warren, Alda Lea, NP  ?lisinopril-hydrochlorothiazide (ZESTORETIC) 20-25 MG tablet TAKE 1 TABLET BY MOUTH ONCE DAILY. 04/05/20 04/25/21  [name], FNP  ?Multiple Vitamin (MULTIVITAMIN) tablet Take 1 tablet by mouth daily.    [provider]  ?terbinafine (LAMISIL) 250 MG tablet Take 1 tablet (250 mg total) by mouth daily. 07/04/20   [name], FNP  ? ? ?Family History ?Family History  ?Problem Relation Age of Onset  ? Diabetes Mother   ? Hypertension Mother   ? Colon polyps Mother   ? Diabetes Maternal Grandmother   ? Hypertension Maternal Grandmother   ? Prostate cancer Maternal Grandfather 32  ? Cervical cancer Maternal  Aunt 40  ?     Deceased  ? Colon cancer Maternal Aunt 82  ? Colon cancer Other   ? Esophageal cancer Neg Hx   ? Rectal cancer Neg Hx   ? Stomach cancer Neg Hx   ? ? ?Social History ?Social History  ? ?Tobacco Use  ? Smoking status: Never  ? Smokeless tobacco: Never  ?Vaping Use  ? Vaping Use: Never used  ?Substance Use Topics  ? Alcohol use: Yes  ?  Alcohol/week: 1.0 standard drink  ?  Types: 1 Glasses of wine per week  ?  Comment: Social  ? Drug use: No  ? ? ? ?Allergies   ?Morphine and related ? ? ?Review of Systems ?Review of Systems  ?Constitutional: Negative.   ?Skin:  Positive for rash.  ?Psychiatric/Behavioral: Negative.    ? ? ?Physical Exam ?Triage Vital Signs ?ED Triage Vitals  ?Enc Vitals Group  ?   BP 07/01/21 1753 (!) 174/100  ?   Pulse Rate 07/01/21 1753 94  ?   Resp 07/01/21 1753 16   ?   Temp 07/01/21 1753 98.5 ?F (36.9 ?C)  ?   Temp Source 07/01/21 1753 Oral  ?   SpO2 07/01/21 1753 95 %  ?   Weight 07/01/21 1752 182 lb 1.6 oz (82.6 kg)  ?   Height 07/01/21 1752 '5\' 3"'$  (1.6 m)  ?   Head Circumference --   ?   Peak Flow --   ?   Pain Score 07/01/21 1751 7  ?   Pain Loc --   ?   Pain Edu? --   ?   Excl. in [place]? --   ? ?No data found. ? ?Updated Vital Signs ?BP (!) 174/100 (BP Location: Left Arm)   Pulse 94   Temp 98.5 ?F (36.9 ?C) (Oral)   Resp 16   Ht '5\' 3"'$  (1.6 m)   Wt 182 lb 1.6 oz (82.6 kg)   LMP 08/26/2017   SpO2 95%   BMI 32.26 kg/m?  ? ?Visual Acuity ?Right Eye Distance:   ?Left Eye Distance:   ?Bilateral Distance:   ? ?Right Eye Near:   ?Left Eye Near:    ?Bilateral Near:    ? ?Physical Exam ?Constitutional:   ?   Appearance: Normal appearance. She is normal weight.  ?Eyes:  ?   Extraocular Movements: Extraocular movements intact.  ?   Pupils: Pupils are equal, round, and reactive to light.  ?Musculoskeletal:  ?   Right shoulder: Normal.  ?Skin: ?   General: Skin is warm and dry.  ?   Findings: Erythema and rash present. Rash is pustular and urticarial.  ?   Comments: Rash located under the right breast and in the middle of her back.  The rash is erythematous, raised, pustular in nature.  There is no congruent pattern as rash is only located on her right side. Grouped vesicles or bullae are noted under the right breast. ?  ?Neurological:  ?   Mental Status: She is alert.  ? ? ? ?UC Treatments / Results  ?Labs ?(all labs ordered are listed, but only abnormal results are displayed) ?Labs Reviewed - No data to display ? ?EKG ? ? ?Radiology ?No results found. ? ?Procedures ?Procedures (including critical care time) ? ?Medications Ordered in UC ?Medications - No data to display ? ?Initial Impression / Assessment and Plan / UC Course  ?I have reviewed the triage vital signs and the nursing notes. ? ?Pertinent labs &  imaging results that were available during my care of the patient were  reviewed by me and considered in my medical decision making (see chart for details). ? ?Patient presents with rash over the past 2 to 3 days.  The rash is unilateral, located under the left breast, and in her middle back.  The rash has changed over the last couple of days.  The rash is now appearing as a grouped vesicle or bullae, erythematous, and she describes the pain as burning, with itching to the rash.  We will go ahead and treat the patient for shingles.  She has not had a shingles vaccine, she is not immunocompromise per her history.  We will also provide prednisone for 5 days to help with itching and inflammation.  Patient encouraged to follow-up with her primary care for a shingles vaccine after her symptoms reside.  Patient verbalizes understanding.  All questions answered. ?Final Clinical Impressions(s) / UC Diagnoses  ? ?Final diagnoses:  ?Rash and nonspecific skin eruption  ? ?Discharge Instructions   ?None ?  ? ?ED Prescriptions   ? ? Medication Sig Dispense Auth. Provider  ? valACYclovir (VALTREX) 1000 MG tablet Take 1 tablet (1,000 mg total) by mouth 3 (three) times daily for 7 days. 21 tablet Awa Bachicha-Warren, Alda Lea, NP  ? predniSONE (DELTASONE) 20 MG tablet Take 1 tablet (20 mg total) by mouth daily with breakfast for 7 days. 7 tablet Thomson Herbers-Warren, Alda Lea, NP  ? ?  ? ?PDMP not reviewed this encounter. ?  ?Tish Men, NP ?07/01/21 1827 ? ?

## 2021-07-01 NOTE — Discharge Instructions (Addendum)
Take medication as prescribed. ?Apply cool cloths or take cool baths to help with itching. ?Follow-up with primary care physician if symptoms do not improve. ?

## 2021-07-01 NOTE — ED Triage Notes (Signed)
Pt reports a rash in the center of breast and mid back x 1 day. States she believes she was having an allergic reaction to something.  ?Pt also reports muscle pain in right shoulder area.  ?

## 2021-08-22 ENCOUNTER — Ambulatory Visit (INDEPENDENT_AMBULATORY_CARE_PROVIDER_SITE_OTHER): Payer: 59 | Admitting: Nurse Practitioner

## 2021-08-22 ENCOUNTER — Encounter: Payer: Self-pay | Admitting: Nurse Practitioner

## 2021-08-22 VITALS — BP 130/74 | HR 90 | Temp 98.1°F | Ht 63.0 in | Wt 177.0 lb

## 2021-08-22 DIAGNOSIS — Z79899 Other long term (current) drug therapy: Secondary | ICD-10-CM | POA: Diagnosis not present

## 2021-08-22 DIAGNOSIS — E6609 Other obesity due to excess calories: Secondary | ICD-10-CM | POA: Diagnosis not present

## 2021-08-22 DIAGNOSIS — Z6832 Body mass index (BMI) 32.0-32.9, adult: Secondary | ICD-10-CM

## 2021-08-22 DIAGNOSIS — Z0000 Encounter for general adult medical examination without abnormal findings: Secondary | ICD-10-CM | POA: Diagnosis not present

## 2021-08-22 DIAGNOSIS — I10 Essential (primary) hypertension: Secondary | ICD-10-CM | POA: Diagnosis not present

## 2021-08-22 DIAGNOSIS — H6121 Impacted cerumen, right ear: Secondary | ICD-10-CM

## 2021-08-22 LAB — POCT URINALYSIS DIPSTICK
Bilirubin, UA: NEGATIVE
Glucose, UA: NEGATIVE
Ketones, UA: NEGATIVE
Leukocytes, UA: NEGATIVE
Nitrite, UA: NEGATIVE
Protein, UA: NEGATIVE
Spec Grav, UA: 1.025 (ref 1.010–1.025)
Urobilinogen, UA: 0.2 E.U./dL
pH, UA: 6 (ref 5.0–8.0)

## 2021-08-22 NOTE — Progress Notes (Signed)
I,Shelby Taylor,acting as a Education administrator for Shelby Brine, FNP.,have documented all relevant documentation on the behalf of Shelby Brine, FNP,as directed by  Shelby Brine, FNP while in the presence of Shelby Taylor, [place].  This visit occurred during the SARS-CoV-2 public health emergency.  Safety protocols were in place, including screening questions prior to the visit, additional usage of staff PPE, and extensive cleaning of exam room while observing appropriate contact time as indicated for disinfecting solutions.  Subjective:     Patient ID: Shelby Taylor , female    DOB: 1965-12-15 , 56 y.o.   MRN: [id number]   Chief Complaint  Patient presents with   Annual Exam    HPI  Pt presents for HM. She reports taking medications as directed. She was treated for shingles in March 2023.      Past Medical History:  Diagnosis Date   Hx of adenomatous colonic polyps 12/20/2019   Hypertension      Family History  Problem Relation Age of Onset   Diabetes Mother    Hypertension Mother    Colon polyps Mother    Diabetes Maternal Grandmother    Hypertension Maternal Grandmother    Prostate cancer Maternal Grandfather 36   Cervical cancer Maternal Aunt 40       Deceased   Colon cancer Maternal Aunt 45   Colon cancer Other    Esophageal cancer Neg Hx    Rectal cancer Neg Hx    Stomach cancer Neg Hx      Current Outpatient Medications:    lisinopril-hydrochlorothiazide (ZESTORETIC) 20-25 MG tablet, TAKE 1 TABLET BY MOUTH ONCE DAILY., Disp: 90 tablet, Rfl: 1   Multiple Vitamin (MULTIVITAMIN) tablet, Take 1 tablet by mouth daily., Disp: , Rfl:    terbinafine (LAMISIL) 250 MG tablet, Take 1 tablet (250 mg total) by mouth daily. (Patient not taking: Reported on 08/22/2021), Disp: 90 tablet, Rfl: 0   Allergies  Allergen Reactions   Morphine And Related     Itching      The patient states she is status post hysterectomy. Negative for: breast discharge, breast lump(s), breast pain and  breast self exam. Associated symptoms include abnormal vaginal bleeding. Pertinent negatives include abnormal bleeding (hematology), anxiety, decreased libido, depression, difficulty falling sleep, dyspareunia, history of infertility, nocturia, sexual dysfunction, sleep disturbances, urinary incontinence, urinary urgency, vaginal discharge and vaginal itching. Diet regular, with low sodium and low sugar, does not eat meat but does eat fish.  The patient states her exercise level is none.  She is currently in school for Mental health NP. It has been difficult to get exercise in.   The patient's tobacco use is:  Social History   Tobacco Use  Smoking Status Never  Smokeless Tobacco Never   She has been exposed to passive smoke. The patient's alcohol use is:  Social History   Substance and Sexual Activity  Alcohol Use Yes   Alcohol/week: 1.0 standard drink   Types: 1 Glasses of wine per week   Comment: Social     Review of Systems  Constitutional: Negative.   HENT: Negative.    Eyes: Negative.   Respiratory: Negative.    Cardiovascular: Negative.   Gastrointestinal: Negative.   Endocrine: Negative.   Genitourinary: Negative.   Musculoskeletal: Negative.   Skin: Negative.   Allergic/Immunologic: Negative.   Neurological: Negative.   Hematological: Negative.   Psychiatric/Behavioral: Negative.      Today's Vitals   08/22/21 0844  BP: 130/74  Pulse: 90  Temp: 98.1  F (36.7 C)  SpO2: 98%  Weight: 177 lb (80.3 kg)  Height: 5' 3"  (1.6 m)   Body mass index is 31.35 kg/m.   Objective:  Physical Exam Vitals reviewed.  Constitutional:      General: She is not in acute distress.    Appearance: Normal appearance. She is well-developed. She is obese.  HENT:     Head: Normocephalic and atraumatic.     Right Ear: Hearing and external ear normal. There is impacted cerumen.     Left Ear: Hearing, tympanic membrane, ear canal and external ear normal. There is no impacted cerumen.      Nose:     Comments: Deferred - masked    Mouth/Throat:     Comments: Deferred - masked Eyes:     General: Lids are normal.     Extraocular Movements: Extraocular movements intact.     Conjunctiva/sclera: Conjunctivae normal.     Pupils: Pupils are equal, round, and reactive to light.     Funduscopic exam:    Right eye: No papilledema.        Left eye: No papilledema.  Neck:     Thyroid: No thyroid mass.     Vascular: No carotid bruit.  Cardiovascular:     Rate and Rhythm: Normal rate and regular rhythm.     Pulses: Normal pulses.     Heart sounds: Normal heart sounds. No murmur heard. Pulmonary:     Effort: Pulmonary effort is normal. No respiratory distress.     Breath sounds: Normal breath sounds. No wheezing.  Abdominal:     General: Abdomen is flat. Bowel sounds are normal. There is no distension.     Palpations: Abdomen is soft.     Tenderness: There is no abdominal tenderness.  Musculoskeletal:        General: No swelling. Normal range of motion.     Cervical back: Full passive range of motion without pain, normal range of motion and neck supple.     Right lower leg: No edema.     Left lower leg: No edema.  Skin:    General: Skin is warm and dry.     Capillary Refill: Capillary refill takes less than 2 seconds.  Neurological:     General: No focal deficit present.     Mental Status: She is alert and oriented to person, place, and time.     Cranial Nerves: No cranial nerve deficit.     Sensory: No sensory deficit.     Motor: No weakness.  Psychiatric:        Mood and Affect: Mood normal.        Behavior: Behavior normal.        Thought Content: Thought content normal.        Judgment: Judgment normal.        Assessment And Plan:     1. Encounter for annual health examination Comments:  Behavior modifications discussed and diet history reviewed.   Pt will continue to exercise regularly and modify diet with low GI, plant based foods and decrease intake of  processed foods.  Recommend intake of daily multivitamin, Vitamin D, and calcium.  Recommend mammogram and colonoscopy for preventive screenings, as well as recommend immunizations that include TDAP, and Shingles. She will return for her shingrix   2. Class 1 obesity due to excess calories with serious comorbidity and body mass index (BMI) of 32.0 to 32.9 in adult Chronic Discussed healthy diet and regular exercise options  Encouraged to exercise at least 150 minutes per week with 2 days of strength training She is encouraged to strive for BMI less than 30 to decrease cardiac risk..  - Hemoglobin A1c  3. Primary hypertension Comments: Taylor pressure is better controlled, continue current medications. EKG done with right atrial enlargement, discussed importance of Taylor pressure control - EKG 12-Lead - POCT Urinalysis Dipstick (81002) - Microalbumin / creatinine urine ratio - CMP14+EGFR - Lipid panel  4. Excessive cerumen in ear canal, right Comments: Removed soft cerumen from ear with lighted curette  5. Other long term (current) drug therapy - CBC     Patient was given opportunity to ask questions. Patient verbalized understanding of the plan and was able to repeat key elements of the plan. All questions were answered to their satisfaction.   Shelby Brine, FNP    I, Shelby Brine, FNP, have reviewed all documentation for this visit. The documentation on 08/22/21 for the exam, diagnosis, procedures, and orders are all accurate and complete.   THE PATIENT IS ENCOURAGED TO PRACTICE SOCIAL DISTANCING DUE TO THE COVID-19 PANDEMIC.

## 2021-08-22 NOTE — Patient Instructions (Signed)

## 2021-08-23 LAB — HEMOGLOBIN A1C
Est. average glucose Bld gHb Est-mCnc: 114 mg/dL
Hgb A1c MFr Bld: 5.6 % (ref 4.8–5.6)

## 2021-08-23 LAB — LIPID PANEL
Chol/HDL Ratio: 3.7 ratio (ref 0.0–4.4)
Cholesterol, Total: 240 mg/dL — ABNORMAL HIGH (ref 100–199)
HDL: 65 mg/dL (ref >39)
LDL Chol Calc (NIH): 165 mg/dL — ABNORMAL HIGH (ref 0–99)
Triglycerides: 61 mg/dL (ref 0–149)
VLDL Cholesterol Cal: 10 mg/dL (ref 5–40)

## 2021-08-23 LAB — MICROALBUMIN / CREATININE URINE RATIO
Creatinine, Urine: 129.6 mg/dL
Microalb/Creat Ratio: 20 mg/g creat (ref 0–29)
Microalbumin, Urine: 26.4 ug/mL

## 2021-08-23 LAB — CMP14+EGFR
ALT: 24 IU/L (ref 0–32)
AST: 21 IU/L (ref 0–40)
Albumin/Globulin Ratio: 1.7 (ref 1.2–2.2)
Albumin: 4.4 g/dL (ref 3.8–4.9)
Alkaline Phosphatase: 92 IU/L (ref 44–121)
BUN/Creatinine Ratio: 23 (ref 9–23)
BUN: 17 mg/dL (ref 6–24)
Bilirubin Total: 0.6 mg/dL (ref 0.0–1.2)
CO2: 26 mmol/L (ref 20–29)
Calcium: 9.9 mg/dL (ref 8.7–10.2)
Chloride: 103 mmol/L (ref 96–106)
Creatinine, Ser: 0.73 mg/dL (ref 0.57–1.00)
Globulin, Total: 2.6 g/dL (ref 1.5–4.5)
Glucose: 99 mg/dL (ref 70–99)
Potassium: 4.2 mmol/L (ref 3.5–5.2)
Sodium: 144 mmol/L (ref 134–144)
Total Protein: 7 g/dL (ref 6.0–8.5)
eGFR: 97 mL/min/{1.73_m2} (ref >59)

## 2021-08-23 LAB — CBC
Hematocrit: 40.1 % (ref 34.0–46.6)
Hemoglobin: 12.6 g/dL (ref 11.1–15.9)
MCH: 26.1 pg — ABNORMAL LOW (ref 26.6–33.0)
MCHC: 31.4 g/dL — ABNORMAL LOW (ref 31.5–35.7)
MCV: 83 fL (ref 79–97)
Platelets: 258 10*3/uL (ref 150–450)
RBC: 4.82 x10E6/uL (ref 3.77–5.28)
RDW: 12.7 % (ref 11.7–15.4)
WBC: 4.1 10*3/uL (ref 3.4–10.8)

## 2021-09-05 ENCOUNTER — Ambulatory Visit (INDEPENDENT_AMBULATORY_CARE_PROVIDER_SITE_OTHER): Payer: 59

## 2021-09-05 VITALS — BP 126/80 | HR 72 | Temp 98.1°F | Ht 63.0 in | Wt 180.2 lb

## 2021-09-05 DIAGNOSIS — Z23 Encounter for immunization: Secondary | ICD-10-CM | POA: Diagnosis not present

## 2021-09-05 NOTE — Progress Notes (Signed)
Pt here today for first shingrix vaccine.  ?

## 2021-10-12 ENCOUNTER — Ambulatory Visit (INDEPENDENT_AMBULATORY_CARE_PROVIDER_SITE_OTHER): Payer: 59 | Admitting: Dermatology

## 2021-10-12 ENCOUNTER — Encounter: Payer: Self-pay | Admitting: Dermatology

## 2021-10-12 DIAGNOSIS — D489 Neoplasm of uncertain behavior, unspecified: Secondary | ICD-10-CM

## 2021-10-12 DIAGNOSIS — L720 Epidermal cyst: Secondary | ICD-10-CM

## 2021-10-12 DIAGNOSIS — D2371 Other benign neoplasm of skin of right lower limb, including hip: Secondary | ICD-10-CM | POA: Diagnosis not present

## 2021-10-12 DIAGNOSIS — D239 Other benign neoplasm of skin, unspecified: Secondary | ICD-10-CM

## 2021-10-12 NOTE — Patient Instructions (Signed)

## 2021-10-19 ENCOUNTER — Ambulatory Visit (INDEPENDENT_AMBULATORY_CARE_PROVIDER_SITE_OTHER): Payer: 59

## 2021-10-19 DIAGNOSIS — Z4802 Encounter for removal of sutures: Secondary | ICD-10-CM

## 2021-10-19 NOTE — Progress Notes (Signed)
NTS Suture removal, No s/s of infection, path to pt. 

## 2021-11-05 ENCOUNTER — Encounter: Payer: Self-pay | Admitting: Dermatology

## 2021-11-05 NOTE — Progress Notes (Signed)
   Follow-Up Visit   Subjective  Shelby Taylor is a 56 y.o. female who presents for the following: Skin Problem (Bump on back of neck- x 1 year- no itch no bleed).  Lesion on back of scalp that she would like removed, also check spot on right upper leg Location:  Duration:  Quality:  Associated Signs/Symptoms: Modifying Factors:  Severity:  Timing: Context:   Objective  Well appearing patient in no apparent distress; mood and affect are within normal limits. Right Thigh - Anterior Firm dirty pink 5 mm noninflamed deep dermal papule, typical dermoscopy  Posterior Mid Neck Cyst on posterior neck which patient requests removal.  Told preoperatively risks of recurrence, infection, unsightly scar.  She did wish to proceed.    A focused examination was performed including head, neck, back, leg. Relevant physical exam findings are noted in the Assessment and Plan.   Assessment & Plan    Dermatofibroma Right Thigh - Anterior  Leave if stable  Epidermoid cyst Posterior Mid Neck  Lesion 1.5 cm, incision 1 cm with dissection of some fibrosis.  Visibly clear base before simple closure.  Skin excision - Posterior Mid Neck  Lesion length (cm):  1.2 Lesion width (cm):  1.2 Margin per side (cm):  0 Total excision diameter (cm):  1.2 Informed consent: discussed and consent obtained   Timeout: patient name, date of birth, surgical site, and procedure verified   Anesthesia: the lesion was anesthetized in a standard fashion   Anesthetic:  1% lidocaine w/ epinephrine 1-100,000 local infiltration Instrument used: #15 blade   Hemostasis achieved with: pressure and electrodesiccation   Outcome: patient tolerated procedure well with no complications   Post-procedure details: sterile dressing applied and wound care instructions given   Dressing type: bandage, petrolatum and pressure dressing    Skin repair - Posterior Mid Neck Complexity:  Simple Final length (cm):  1 Informed  consent: discussed and consent obtained   Timeout: patient name, date of birth, surgical site, and procedure verified   Procedure prep:  Patient was prepped and draped in usual sterile fashion (non sterile) Prep type:  Chlorhexidine Anesthesia: the lesion was anesthetized in a standard fashion   Undermining: edges undermined   Fine/surface layer approximation (top stitches):  Suture size:  5-0 Suture type: nylon   Suture type comment:  Nylon Stitches: simple interrupted   Suture removal (days):  7 Hemostasis achieved with: suture, pressure and electrodesiccation Outcome: patient tolerated procedure well with no complications   Post-procedure details: sterile dressing applied and wound care instructions given   Post-procedure details comment:  Non sterile pressure  Dressing type: bandage and petrolatum   Additional details:  5-0 Ethilon x 2  Specimen 1 - Surgical pathology Differential Diagnosis: R/O Cyst  Check Margins: No      I, Lavonna Monarch, MD, have reviewed all documentation for this visit.  The documentation on 11/05/21 for the exam, diagnosis, procedures, and orders are all accurate and complete.

## 2021-11-08 IMAGING — MG DIGITAL SCREENING IMPLANTS W/ CAD
9 of 10 series · 9 of 10 positions shown · non-contrast
Comparison: Previous exam(s).

CLINICAL DATA: Screening.

EXAM:
DIGITAL SCREENING BILATERAL MAMMOGRAM WITH IMPLANTS AND CAD
The patient has retropectoral implants. Standard and implant
displaced views were performed.

[R MLO (1 of 3)]
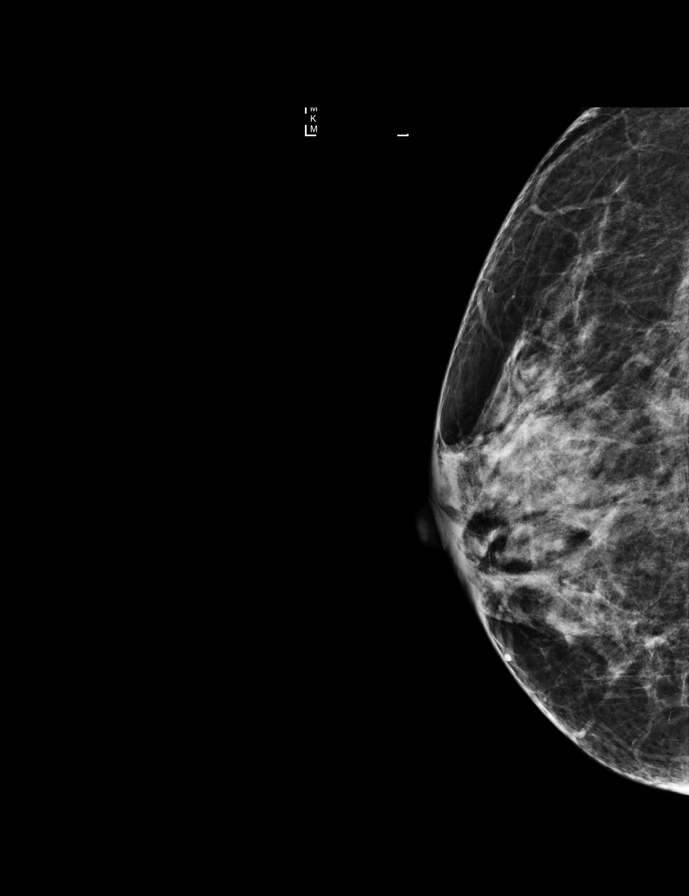

[R CC (1 of 2)]
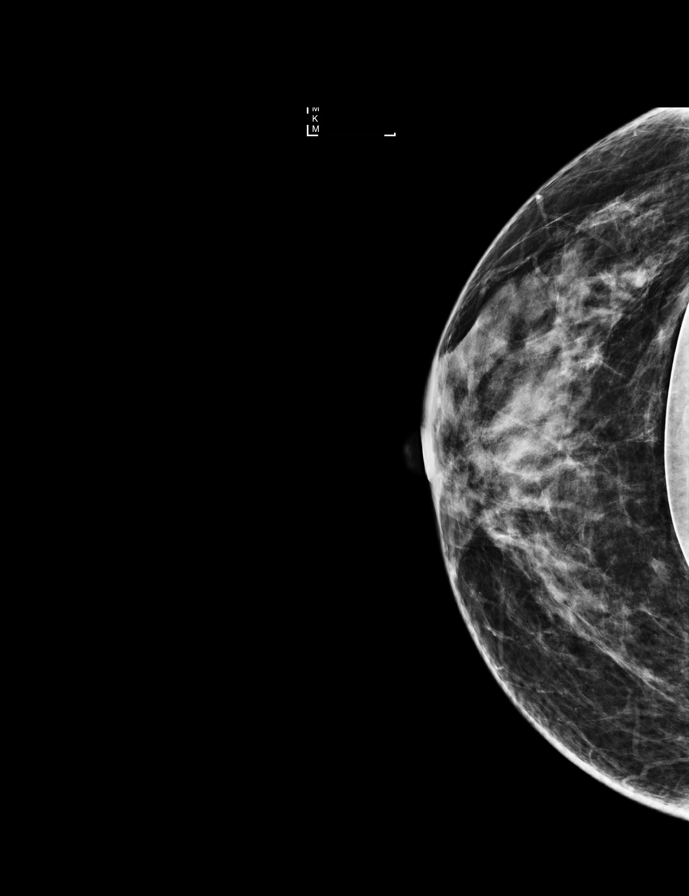

[R MLO (2 of 3)]
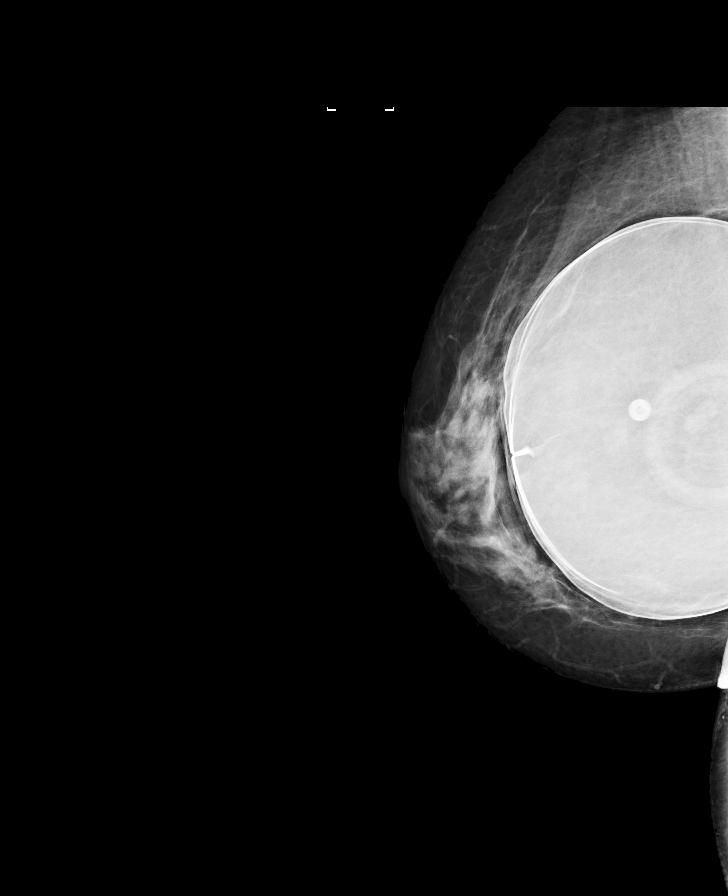

[R MLO (3 of 3)]
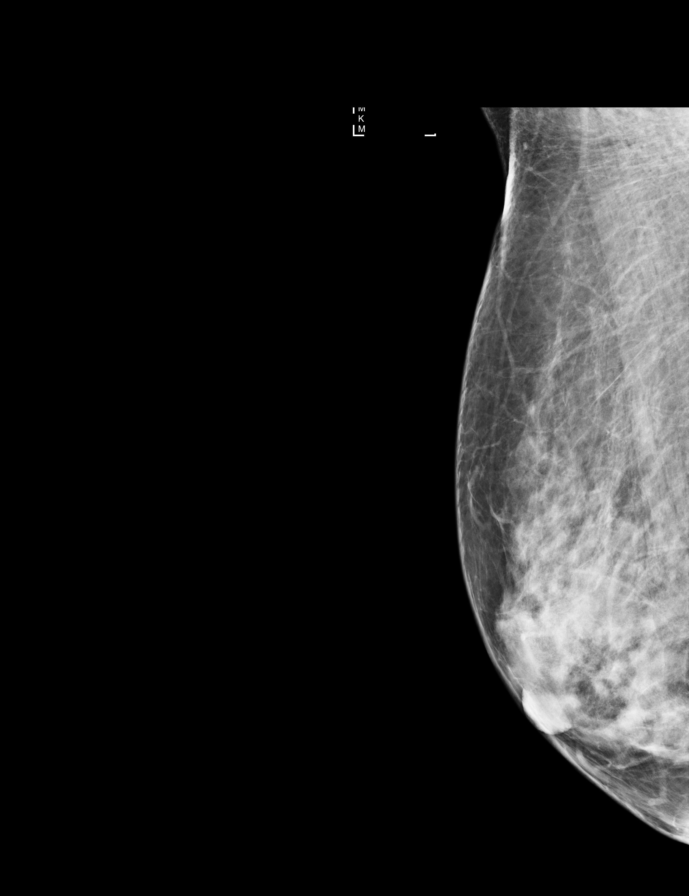

[L MLO (1 of 3)]
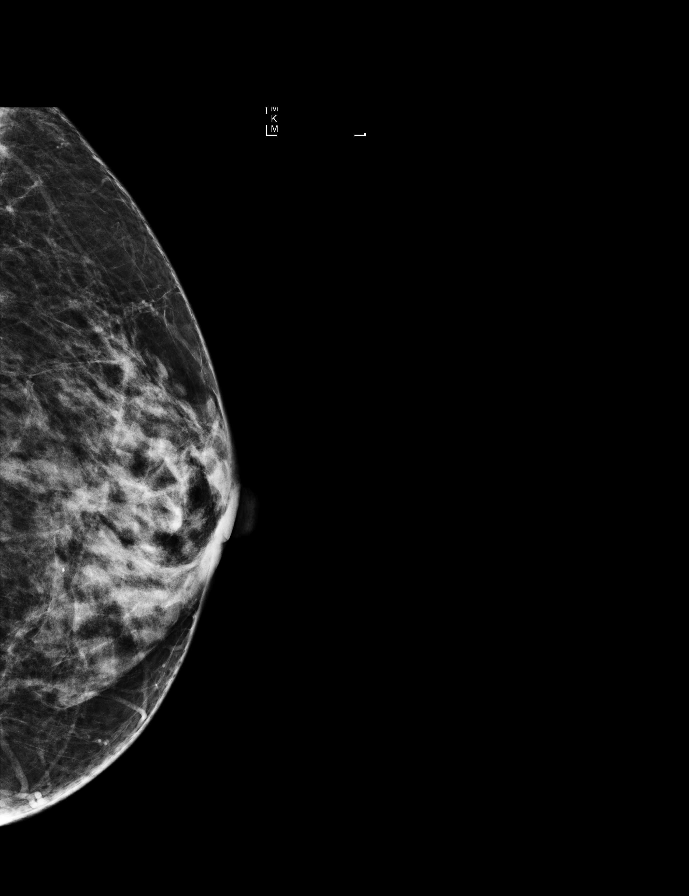

[L MLO (2 of 3)]
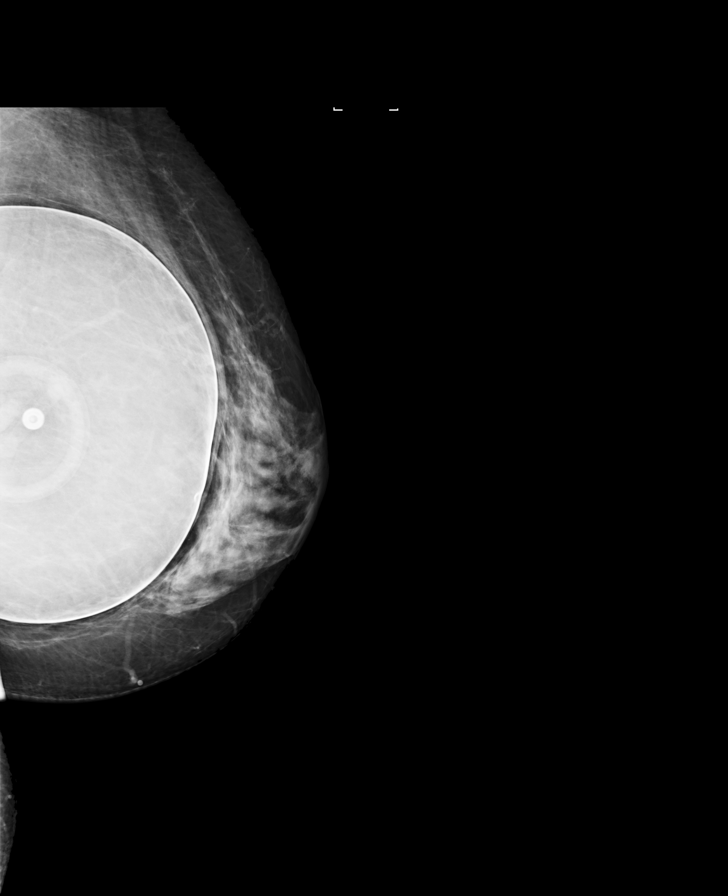

[L CC]
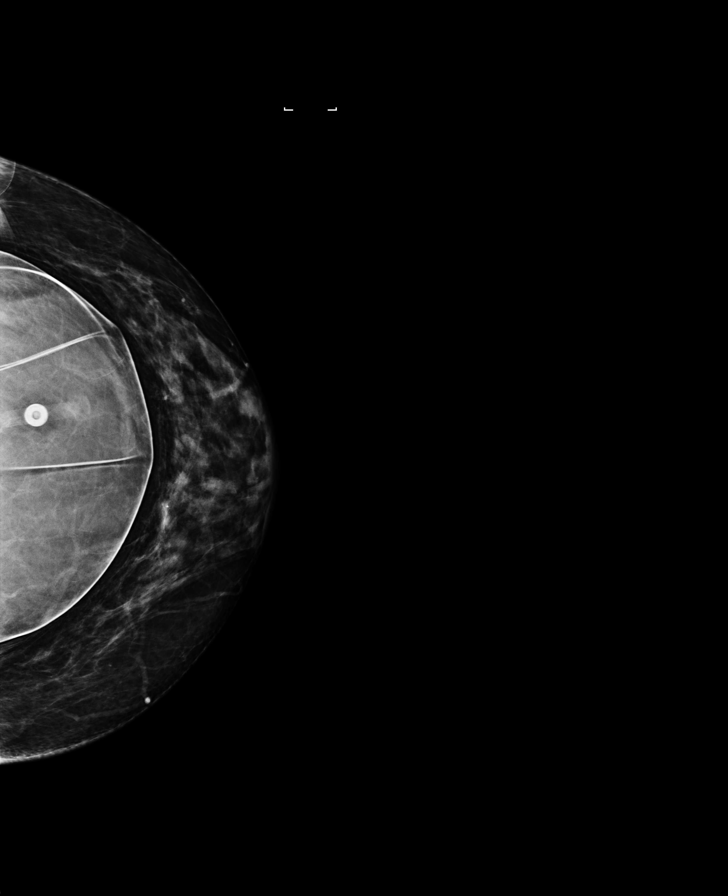

[L MLO (3 of 3)]
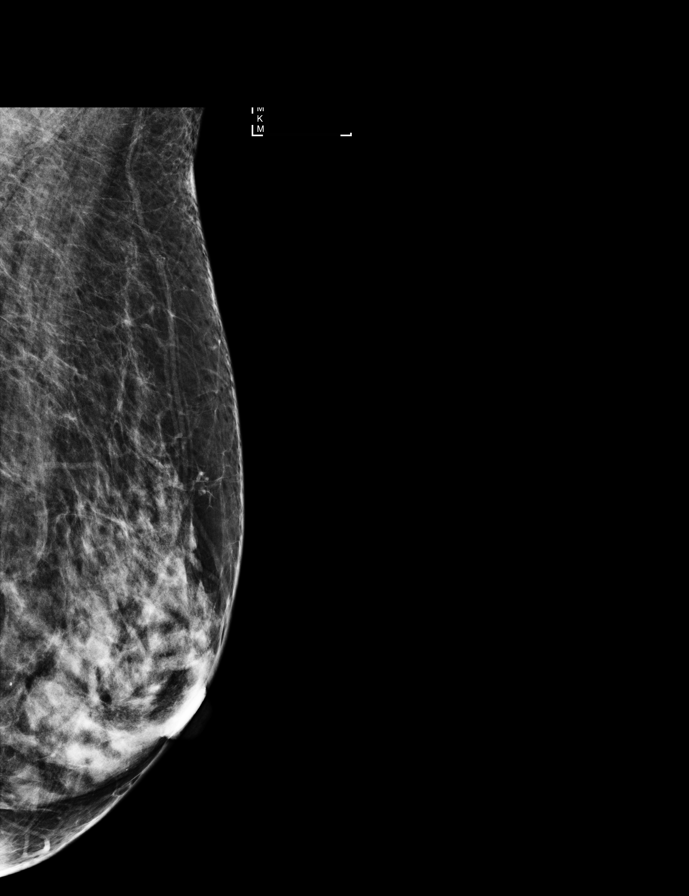

[R CC (2 of 2)]
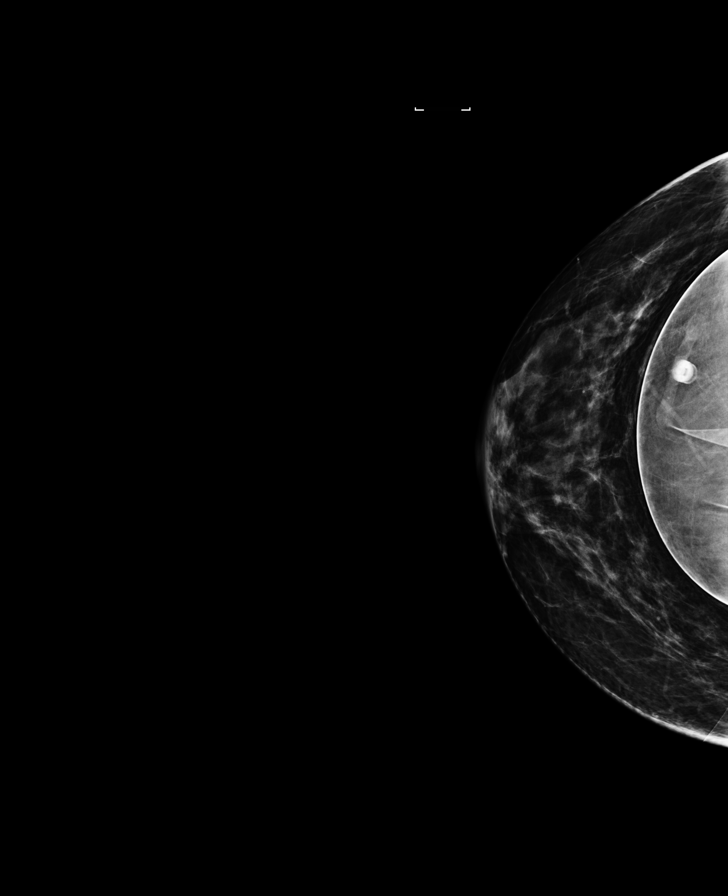

[9 of 10 positions shown; findings below may reference images not displayed]

ACR Breast Density Category c: The breast tissue is heterogeneously
dense, which may obscure small masses.
FINDINGS: There are no findings suspicious for malignancy. Images were
processed with CAD.
IMPRESSION: No mammographic evidence of malignancy. A result letter of this
screening mammogram will be mailed directly to the patient.

RECOMMENDATION:
Screening mammogram in one year. (Code:R9-8-RFM)

BI-RADS CATEGORY  1:  Negative.

## 2021-12-06 ENCOUNTER — Ambulatory Visit: Payer: 59 | Admitting: Dermatology

## 2021-12-25 ENCOUNTER — Ambulatory Visit: Payer: Commercial Managed Care - PPO | Admitting: Nurse Practitioner

## 2022-01-10 ENCOUNTER — Encounter: Payer: Self-pay | Admitting: Nurse Practitioner

## 2022-01-10 ENCOUNTER — Other Ambulatory Visit (HOSPITAL_COMMUNITY): Payer: Self-pay

## 2022-01-10 ENCOUNTER — Ambulatory Visit (INDEPENDENT_AMBULATORY_CARE_PROVIDER_SITE_OTHER): Payer: Commercial Managed Care - PPO | Admitting: Nurse Practitioner

## 2022-01-10 VITALS — BP 130/74 | HR 90 | Temp 98.5°F | Ht 63.0 in | Wt 183.0 lb

## 2022-01-10 DIAGNOSIS — T7840XA Allergy, unspecified, initial encounter: Secondary | ICD-10-CM

## 2022-01-10 DIAGNOSIS — E559 Vitamin D deficiency, unspecified: Secondary | ICD-10-CM

## 2022-01-10 DIAGNOSIS — Z111 Encounter for screening for respiratory tuberculosis: Secondary | ICD-10-CM | POA: Diagnosis not present

## 2022-01-10 DIAGNOSIS — Z23 Encounter for immunization: Secondary | ICD-10-CM

## 2022-01-10 DIAGNOSIS — Z6832 Body mass index (BMI) 32.0-32.9, adult: Secondary | ICD-10-CM

## 2022-01-10 DIAGNOSIS — I10 Essential (primary) hypertension: Secondary | ICD-10-CM

## 2022-01-10 DIAGNOSIS — L659 Nonscarring hair loss, unspecified: Secondary | ICD-10-CM | POA: Diagnosis not present

## 2022-01-10 MED ORDER — LISINOPRIL 20 MG PO TABS
20.0000 mg | ORAL_TABLET | Freq: Every day | ORAL | 1 refills | Status: DC
  Filled 2022-01-10: qty 90, 90d supply, fill #0
  Filled 2022-04-19: qty 90, 90d supply, fill #1

## 2022-01-10 MED ORDER — NALTREXONE-BUPROPION HCL ER 8-90 MG PO TB12
ORAL_TABLET | ORAL | 1 refills | Status: DC
  Filled 2022-01-10 (×4): qty 120, 47d supply, fill #0

## 2022-01-10 MED ORDER — TRIAMCINOLONE ACETONIDE 0.5 % EX OINT
1.0000 | TOPICAL_OINTMENT | Freq: Two times a day (BID) | CUTANEOUS | 0 refills | Status: AC
  Filled 2022-01-10: qty 30, 15d supply, fill #0

## 2022-01-10 NOTE — Progress Notes (Signed)
Barnet Glasgow Martin,acting as a Education administrator for [name], FNP.,have documented all relevant documentation on the behalf of [name], FNP,as directed by  [name], FNP while in the presence of [name], [place].    Subjective:     Patient ID: Shelby Taylor , female    DOB: [birth year] , 56 y.o.   MRN: [id number]   Chief Complaint  Patient presents with   Hypertension    HPI  Patient presents today for a bp check. She is requesting to change her Taylor pressure medications to a medication that may not cause hair loss.  She is drinking on average 48 oz of water a day.   Patient also needs cream because she is allergic to metals when she wears earrings her ears react. Patient wants a referral for her mammogram.   Breakfast yogurt with granola, [place] sausage patty. May have an egg. Will try to go light on bread. She typically does not do meat  Lunch - Salad with vinagerette with light trout or salmon (has been eating a creamier dressing). May have left overs from dinner with rice or potatoes  Dinner - fish and rice/potatoes  Snacks - will eat chips, peanuts and fruit.   Drinks water or flavoring packets, limited alcoholic beverages.   She has been walking more regularly - 3 to 4 days a week, power walk 1-2 miles. No strength training.   BP Readings from Last 3 Encounters: 01/10/22 : 130/74 09/05/21 : 126/80 08/22/21 : 130/74       Past Medical History:  Diagnosis Date   Hx of adenomatous colonic polyps 12/20/2019   Hypertension      Family History  Problem Relation Age of Onset   Diabetes Mother    Hypertension Mother    Colon polyps Mother    Diabetes Maternal Grandmother    Hypertension Maternal Grandmother    Prostate cancer Maternal Grandfather 63   Cervical cancer Maternal Aunt 40       Deceased   Colon cancer Maternal Aunt 25   Colon cancer Other    Esophageal cancer Neg Hx    Rectal cancer Neg Hx    Stomach cancer Neg Hx      Current Outpatient  Medications:    lisinopril (ZESTRIL) 20 MG tablet, Take 1 tablet (20 mg total) by mouth daily., Disp: 90 tablet, Rfl: 1   Multiple Vitamin (MULTIVITAMIN) tablet, Take 1 tablet by mouth daily., Disp: , Rfl:    Naltrexone-buPROPion HCl ER 8-90 MG TB12, Start 1 tablet every morning for 7 days, then 1 tablet twice daily for 7 days, then 2 tablets every morning and one in the evening, Disp: 120 tablet, Rfl: 1   triamcinolone ointment (KENALOG) 0.5 %, Apply 1 Application topically 2 (two) times daily., Disp: 30 g, Rfl: 0   terbinafine (LAMISIL) 250 MG tablet, Take 1 tablet (250 mg total) by mouth daily. (Patient not taking: Reported on 01/10/2022), Disp: 90 tablet, Rfl: 0   Allergies  Allergen Reactions   Morphine And Related     Itching   Other      Review of Systems  Constitutional: Negative.   HENT: Negative.    Eyes: Negative.   Respiratory: Negative.    Cardiovascular: Negative.   Gastrointestinal: Negative.   Neurological: Negative.   Psychiatric/Behavioral: Negative.       Today's Vitals   01/10/22 0834  BP: 130/74  Pulse: 90  Temp: 98.5 F (36.9 C)  TempSrc: Oral  Weight: 183 lb (83  kg)  Height: _0  (1.6 m)  PainSc: 0-No pain   Body mass index is 32.42 kg/m.  Wt Readings from Last 3 Encounters:  01/10/22 183 lb (83 kg)  09/05/21 180 lb 3.2 oz (81.7 kg)  08/22/21 177 lb (80.3 kg)    Objective:  Physical Exam Vitals reviewed.  Constitutional:      General: She is not in acute distress.    Appearance: Normal appearance.  Cardiovascular:     Rate and Rhythm: Normal rate.     Pulses: Normal pulses.     Heart sounds: Normal heart sounds. No murmur heard. Pulmonary:     Effort: Pulmonary effort is normal. No respiratory distress.     Breath sounds: Normal breath sounds. No wheezing.  Skin:    General: Skin is warm and dry.     Capillary Refill: Capillary refill takes less than 2 seconds.     Findings: Rash (ear lobes) present.  Neurological:     General: No  focal deficit present.     Mental Status: She is alert and oriented to person, place, and time.     Cranial Nerves: No cranial nerve deficit.     Motor: No weakness.  Psychiatric:        Mood and Affect: Mood normal.        Behavior: Behavior normal.        Thought Content: Thought content normal.        Judgment: Judgment normal.         Assessment And Plan:     1. Primary hypertension Comments: Controlled, continue current medications. Stopped HCTZ due to side effects of hair loss, will return in 2-3 weeks for [place] Taylor pressure. - BMP8+eGFR - lisinopril (ZESTRIL) 20 MG tablet; Take 1 tablet (20 mg total) by mouth daily.  Dispense: 90 tablet; Refill: 1  2. Vitamin D deficiency Will check vitamin D level and supplement as needed.    Also encouraged to spend 15 minutes in the sun daily.  - Vitamin D (25 hydroxy)  3. Hair loss Comments: Hair thinning possibly related to HCTZ, will discontinue and encouraged to stay well hydrated with water.   4. Allergic reaction, initial encounter Comments: Irritation to her right ear lobe from nickel in the earring. Will treat with steroid cream - triamcinolone ointment (KENALOG) 0.5 %; Apply 1 Application topically 2 (two) times daily.  Dispense: 30 g; Refill: 0  5. Need for influenza vaccination Influenza vaccine administered Encouraged to take Tylenol as needed for fever or muscle aches. - Flu Vaccine QUAD 6+ mos PF IM (Fluarix Quad PF)  6. Screening for tuberculosis - QuantiFERON-TB Gold Plus  7. BMI 32.0-32.9,adult Comments: Long discussion about weight loss and incorportaing more physical activity. Will try contrave, samples given will sed for PA.  - Naltrexone-buPROPion HCl ER 8-90 MG TB12; Start 1 tablet every morning for 7 days, then 1 tablet twice daily for 7 days, then 2 tablets every morning and one in the evening  Dispense: 120 tablet; Refill: 1    Patient was given opportunity to ask questions. Patient verbalized  understanding of the plan and was able to repeat key elements of the plan. All questions were answered to their satisfaction.  [name], FNP    I, [name], FNP, have reviewed all documentation for this visit. The documentation on 01/10/22 for the exam, diagnosis, procedures, and orders are all accurate and complete.  IF YOU HAVE BEEN REFERRED TO A SPECIALIST, IT MAY TAKE 1-2  WEEKS TO SCHEDULE/PROCESS THE REFERRAL. IF YOU HAVE NOT HEARD FROM US/SPECIALIST IN TWO WEEKS, PLEASE GIVE [place] A CALL AT [phone].   THE PATIENT IS ENCOURAGED TO PRACTICE SOCIAL DISTANCING DUE TO THE COVID-19 PANDEMIC.

## 2022-01-10 NOTE — Patient Instructions (Signed)
Hypertension, Adult ?Hypertension is another name for high blood pressure. High blood pressure forces your heart to work harder to pump blood. This can cause problems over time. ?There are two numbers in a blood pressure reading. There is a top number (systolic) over a bottom number (diastolic). It is best to have a blood pressure that is below 120/80. ?What are the causes? ?The cause of this condition is not known. Some other conditions can lead to high blood pressure. ?What increases the risk? ?Some lifestyle factors can make you more likely to develop high blood pressure: ?Smoking. ?Not getting enough exercise or physical activity. ?Being overweight. ?Having too much fat, sugar, calories, or salt (sodium) in your diet. ?Drinking too much alcohol. ?Other risk factors include: ?Having any of these conditions: ?Heart disease. ?Diabetes. ?High cholesterol. ?Kidney disease. ?Obstructive sleep apnea. ?Having a family history of high blood pressure and high cholesterol. ?Age. The risk increases with age. ?Stress. ?What are the signs or symptoms? ?High blood pressure may not cause symptoms. Very high blood pressure (hypertensive crisis) may cause: ?Headache. ?Fast or uneven heartbeats (palpitations). ?Shortness of breath. ?Nosebleed. ?Vomiting or feeling like you may vomit (nauseous). ?Changes in how you see. ?Very bad chest pain. ?Feeling dizzy. ?Seizures. ?How is this treated? ?This condition is treated by making healthy lifestyle changes, such as: ?Eating healthy foods. ?Exercising more. ?Drinking less alcohol. ?Your doctor may prescribe medicine if lifestyle changes do not help enough and if: ?Your top number is above 130. ?Your bottom number is above 80. ?Your personal target blood pressure may vary. ?Follow these instructions at home: ?Eating and drinking ? ?If told, follow the DASH eating plan. To follow this plan: ?Fill one half of your plate at each meal with fruits and vegetables. ?Fill one fourth of your plate  at each meal with whole grains. Whole grains include whole-wheat pasta, brown rice, and whole-grain bread. ?Eat or drink low-fat dairy products, such as skim milk or low-fat yogurt. ?Fill one fourth of your plate at each meal with low-fat (lean) proteins. Low-fat proteins include fish, chicken without skin, eggs, beans, and tofu. ?Avoid fatty meat, cured and processed meat, or chicken with skin. ?Avoid pre-made or processed food. ?Limit the amount of salt in your diet to less than 1,500 mg each day. ?Do not drink alcohol if: ?Your doctor tells you not to drink. ?You are pregnant, may be pregnant, or are planning to become pregnant. ?If you drink alcohol: ?Limit how much you have to: ?0-1 drink a day for women. ?0-2 drinks a day for men. ?Know how much alcohol is in your drink. In the [place] one drink equals one 12 oz bottle of beer (355 mL), one 5 oz glass of wine (148 mL), or one 1? oz glass of hard liquor (44 mL). ?Lifestyle ? ?Work with your doctor to stay at a healthy weight or to lose weight. Ask your doctor what the best weight is for you. ?Get at least 30 minutes of exercise that causes your heart to beat faster (aerobic exercise) most days of the week. This may include walking, swimming, or biking. ?Get at least 30 minutes of exercise that strengthens your muscles (resistance exercise) at least 3 days a week. This may include lifting weights or doing Pilates. ?Do not smoke or use any products that contain nicotine or tobacco. If you need help quitting, ask your doctor. ?Check your blood pressure at home as told by your doctor. ?Keep all follow-up visits. ?Medicines ?Take over-the-counter and prescription medicines   only as told by your doctor. Follow directions carefully. ?Do not skip doses of blood pressure medicine. The medicine does not work as well if you skip doses. Skipping doses also puts you at risk for problems. ?Ask your doctor about side effects or reactions to medicines that you should watch  for. ?Contact a doctor if: ?You think you are having a reaction to the medicine you are taking. ?You have headaches that keep coming back. ?You feel dizzy. ?You have swelling in your ankles. ?You have trouble with your vision. ?Get help right away if: ?You get a very bad headache. ?You start to feel mixed up (confused). ?You feel weak or numb. ?You feel faint. ?You have very bad pain in your: ?Chest. ?Belly (abdomen). ?You vomit more than once. ?You have trouble breathing. ?These symptoms may be an emergency. Get help right away. Call 911. ?Do not wait to see if the symptoms will go away. ?Do not drive yourself to the hospital. ?Summary ?Hypertension is another name for high blood pressure. ?High blood pressure forces your heart to work harder to pump blood. ?For most people, a normal blood pressure is less than 120/80. ?Making healthy choices can help lower blood pressure. If your blood pressure does not get lower with healthy choices, you may need to take medicine. ?This information is not intended to replace advice given to you by your health care provider. Make sure you discuss any questions you have with your health care provider. ?Document Revised: 02/02/2021 Document Reviewed: 02/02/2021 ?Elsevier Patient Education ? 2023 [employer] ? ?

## 2022-01-15 ENCOUNTER — Telehealth: Payer: Self-pay

## 2022-01-15 LAB — VITAMIN D 25 HYDROXY (VIT D DEFICIENCY, FRACTURES): Vit D, 25-Hydroxy: 29.1 ng/mL — ABNORMAL LOW (ref 30.0–100.0)

## 2022-01-15 LAB — QUANTIFERON-TB GOLD PLUS
QuantiFERON Mitogen Value: >10 IU/mL
QuantiFERON Nil Value: 0.05 IU/mL
QuantiFERON TB1 Ag Value: 0.1 IU/mL
QuantiFERON TB2 Ag Value: 0.08 IU/mL
QuantiFERON-TB Gold Plus: NEGATIVE

## 2022-01-15 LAB — BMP8+EGFR
BUN/Creatinine Ratio: 22 (ref 9–23)
BUN: 14 mg/dL (ref 6–24)
CO2: 23 mmol/L (ref 20–29)
Calcium: 10 mg/dL (ref 8.7–10.2)
Chloride: 101 mmol/L (ref 96–106)
Creatinine, Ser: 0.64 mg/dL (ref 0.57–1.00)
Glucose: 93 mg/dL (ref 70–99)
Potassium: 4.1 mmol/L (ref 3.5–5.2)
Sodium: 141 mmol/L (ref 134–144)
eGFR: 104 mL/min/{1.73_m2} (ref >59)

## 2022-01-15 NOTE — Telephone Encounter (Signed)
Called patient to inform patient contrave er 8-'90mg'$  tablet has been approved from 01/13/2022-02/12/2022

## 2022-01-23 ENCOUNTER — Other Ambulatory Visit: Payer: Self-pay

## 2022-01-23 ENCOUNTER — Other Ambulatory Visit (HOSPITAL_COMMUNITY): Payer: Self-pay

## 2022-01-23 DIAGNOSIS — Z6832 Body mass index (BMI) 32.0-32.9, adult: Secondary | ICD-10-CM

## 2022-01-23 MED ORDER — VITAMIN D3 50 MCG (2000 UT) PO TABS
50.0000 ug | ORAL_TABLET | ORAL | 3 refills | Status: AC
  Filled 2022-01-23: qty 30, 30d supply, fill #0

## 2022-01-23 MED ORDER — NALTREXONE-BUPROPION HCL ER 8-90 MG PO TB12
ORAL_TABLET | ORAL | 1 refills | Status: DC
  Filled 2022-01-23 (×2): qty 120, 30d supply, fill #0
  Filled 2022-01-24: qty 63, 28d supply, fill #0

## 2022-01-24 ENCOUNTER — Other Ambulatory Visit (HOSPITAL_COMMUNITY): Payer: Self-pay

## 2022-01-30 ENCOUNTER — Ambulatory Visit: Payer: 59

## 2022-01-30 ENCOUNTER — Other Ambulatory Visit (HOSPITAL_COMMUNITY): Payer: Self-pay

## 2022-01-30 VITALS — BP 158/90 | HR 94 | Temp 98.2°F

## 2022-01-30 DIAGNOSIS — I10 Essential (primary) hypertension: Secondary | ICD-10-CM

## 2022-01-30 MED ORDER — AMLODIPINE BESYLATE 2.5 MG PO TABS
2.5000 mg | ORAL_TABLET | Freq: Every day | ORAL | 11 refills | Status: DC
  Filled 2022-01-30: qty 30, 30d supply, fill #0

## 2022-01-30 NOTE — Progress Notes (Signed)
Patient presents today for bpc. Patient is currently taking Lisinopril '20mg'$  daily.   BP Readings from Last 3 Encounters:  01/30/22 (!) 160/90  01/10/22 130/74  09/05/21 126/80   Provider would like to add amlodipine 2.'5mg'$  nightly. Pt admits that she may not take the medication. Follow up appointment scheduled for 3 weeks.

## 2022-02-19 ENCOUNTER — Ambulatory Visit: Payer: 59 | Admitting: Nurse Practitioner

## 2022-03-15 ENCOUNTER — Ambulatory Visit: Payer: 59 | Admitting: Nurse Practitioner

## 2022-04-19 ENCOUNTER — Other Ambulatory Visit (HOSPITAL_COMMUNITY): Payer: Self-pay

## 2022-04-19 ENCOUNTER — Other Ambulatory Visit: Payer: Self-pay

## 2022-06-27 ENCOUNTER — Ambulatory Visit
Admission: RE | Admit: 2022-06-27 | Discharge: 2022-06-27 | Disposition: A | Discharge status: Home / Self Care | Payer: Self-pay | Source: Ambulatory Visit | Attending: Nurse Practitioner | Admitting: Nurse Practitioner

## 2022-06-27 ENCOUNTER — Ambulatory Visit (INDEPENDENT_AMBULATORY_CARE_PROVIDER_SITE_OTHER): Payer: Self-pay | Admitting: Nurse Practitioner

## 2022-06-27 ENCOUNTER — Encounter: Payer: Self-pay | Admitting: Nurse Practitioner

## 2022-06-27 VITALS — BP 132/88 | HR 94 | Temp 98.3°F | Ht 63.0 in | Wt 179.0 lb

## 2022-06-27 DIAGNOSIS — I10 Essential (primary) hypertension: Secondary | ICD-10-CM

## 2022-06-27 DIAGNOSIS — Z01818 Encounter for other preprocedural examination: Secondary | ICD-10-CM

## 2022-06-27 DIAGNOSIS — E785 Hyperlipidemia, unspecified: Secondary | ICD-10-CM | POA: Insufficient documentation

## 2022-06-27 DIAGNOSIS — Z6832 Body mass index (BMI) 32.0-32.9, adult: Secondary | ICD-10-CM

## 2022-06-27 NOTE — Progress Notes (Signed)
I,Sheena H Holbrook,acting as a Education administrator for [name], FNP.,have documented all relevant documentation on the behalf of [name], FNP,as directed by  [name], FNP while in the presence of [name], [place].    Subjective:     Patient ID: Shelby Taylor , female    DOB: 26-Jun-1965 , 57 y.o.   MRN: [id number]   Chief Complaint  Patient presents with   Pre-op Exam    HPI  Patient presents today for pre-surgical clearance. Patient is scheduled for liposuction surgery on 08/29/22 with Dr. Jefferson Fuel. They are requiring patient to have CMP, CBC, PTT/PtInr, stress test, cxr and EKG.   She stopped taking amlodipine a day or two. She feels her Taylor pressure increased due to contrave and she weaned herself off the medication and did not take anymore of the amlodipine. Her Taylor pressure has been elevated 130's/80's.   Wt Readings from Last 3 Encounters: 06/27/22 : 179 lb (81.2 kg) 01/10/22 : 183 lb (83 kg) 09/05/21 : 180 lb 3.2 oz (81.7 kg)  She is no longer taking contrave.       Past Medical History:  Diagnosis Date   Hx of adenomatous colonic polyps 12/20/2019   Hypertension      Family History  Problem Relation Age of Onset   Diabetes Mother    Hypertension Mother    Colon polyps Mother    Diabetes Maternal Grandmother    Hypertension Maternal Grandmother    Prostate cancer Maternal Grandfather 50   Cervical cancer Maternal Aunt 40       Deceased   Colon cancer Maternal Aunt 58   Colon cancer Other    Esophageal cancer Neg Hx    Rectal cancer Neg Hx    Stomach cancer Neg Hx      Current Outpatient Medications:    Cholecalciferol (VITAMIN D3) 50 MCG (2000 UT) TABS, Take 50 mcg by mouth as directed., Disp: 30 tablet, Rfl: 3   lisinopril (ZESTRIL) 20 MG tablet, Take 1 tablet (20 mg total) by mouth daily., Disp: 90 tablet, Rfl: 1   Multiple Vitamin (MULTIVITAMIN) tablet, Take 1 tablet by mouth daily., Disp: , Rfl:    triamcinolone ointment (KENALOG) 0.5 %,  Apply 1 Application topically 2 (two) times daily., Disp: 30 g, Rfl: 0   amLODipine (NORVASC) 2.5 MG tablet, Take 1 tablet (2.5 mg total) by mouth daily. (Patient not taking: Reported on 06/27/2022), Disp: 30 tablet, Rfl: 11   Allergies  Allergen Reactions   Morphine And Related     Itching   Other      Review of Systems  Constitutional: Negative.   Respiratory: Negative.    All other systems reviewed and are negative.    Today's Vitals   06/27/22 0849  BP: 132/88  Pulse: 94  Temp: 98.3 F (36.8 C)  TempSrc: Oral  SpO2: 98%  Weight: 179 lb (81.2 kg)  Height: '5\' 3"'$  (1.6 m)   Body mass index is 31.71 kg/m.   Objective:  Physical Exam Vitals reviewed.  Constitutional:      General: She is not in acute distress.    Appearance: Normal appearance. She is obese.  Cardiovascular:     Rate and Rhythm: Normal rate.     Pulses: Normal pulses.     Heart sounds: Normal heart sounds. No murmur heard. Pulmonary:     Effort: Pulmonary effort is normal. No respiratory distress.     Breath sounds: Normal breath sounds. No wheezing.  Skin:  General: Skin is warm and dry.     Capillary Refill: Capillary refill takes less than 2 seconds.     Findings: No rash.  Neurological:     General: No focal deficit present.     Mental Status: She is alert and oriented to person, place, and time.     Cranial Nerves: No cranial nerve deficit.     Motor: No weakness.  Psychiatric:        Mood and Affect: Mood normal.        Behavior: Behavior normal.        Thought Content: Thought content normal.        Judgment: Judgment normal.         Assessment And Plan:     1. Primary hypertension Comments: Taylor pressure is fairly controlled.  Encouraged to focus on lifestyle modification.  Intake high salt foods..  Increase water to at least 64 ounces per day. - EKG 12-Lead - DG Chest 2 View; Future - Exercise Tolerance Test; Future  2. BMI 32.0-32.9,adult Comments: Long discussion about  weight loss and incorportaing more physical activity. Will try contrave, samples given will sed for PA.   3. Pre-operative clearance Comments: EKG done.  Normal sinus rhythm.  Will send for an exercise stress test per request by my surgeon.  Will clear once lab results/findings - DG Chest 2 View; Future - Exercise Tolerance Test; Future    Patient was given opportunity to ask questions. Patient verbalized understanding of the plan and was able to repeat key elements of the plan. All questions were answered to their satisfaction.  [name], FNP    I, [name], FNP, have reviewed all documentation for this visit. The documentation on 06/26/22 for the exam, diagnosis, procedures, and orders are all accurate and complete.  IF YOU HAVE BEEN REFERRED TO A SPECIALIST, IT MAY TAKE 1-2 WEEKS TO SCHEDULE/PROCESS THE REFERRAL. IF YOU HAVE NOT HEARD FROM US/SPECIALIST IN TWO WEEKS, PLEASE GIVE [place] A CALL AT [phone].   THE PATIENT IS ENCOURAGED TO PRACTICE SOCIAL DISTANCING DUE TO THE COVID-19 PANDEMIC.

## 2022-07-01 ENCOUNTER — Other Ambulatory Visit: Payer: Self-pay | Admitting: Nurse Practitioner

## 2022-07-04 ENCOUNTER — Other Ambulatory Visit: Payer: Self-pay | Admitting: Nurse Practitioner

## 2022-07-04 ENCOUNTER — Ambulatory Visit: Payer: Self-pay | Attending: Nurse Practitioner

## 2022-07-04 DIAGNOSIS — Z01818 Encounter for other preprocedural examination: Secondary | ICD-10-CM

## 2022-07-04 DIAGNOSIS — R9439 Abnormal result of other cardiovascular function study: Secondary | ICD-10-CM

## 2022-07-04 DIAGNOSIS — I10 Essential (primary) hypertension: Secondary | ICD-10-CM

## 2022-07-04 LAB — EXERCISE TOLERANCE TEST
Angina Index: 0
Estimated workload: 7
Exercise duration (min): 6 min
Exercise duration (sec): 0 s
MPHR: 164 {beats}/min
Peak HR: 171 {beats}/min
Percent HR: 104 %
RPE: 13
Rest HR: 100 {beats}/min

## 2022-07-11 ENCOUNTER — Ambulatory Visit: Payer: Self-pay | Admitting: Nurse Practitioner

## 2022-07-31 ENCOUNTER — Encounter: Payer: Self-pay | Admitting: Cardiology

## 2022-07-31 ENCOUNTER — Ambulatory Visit: Payer: Medicaid Other | Attending: Cardiology | Admitting: Cardiology

## 2022-07-31 ENCOUNTER — Other Ambulatory Visit (HOSPITAL_COMMUNITY): Payer: Self-pay

## 2022-07-31 VITALS — BP 154/90 | HR 106 | Ht 64.0 in | Wt 180.6 lb

## 2022-07-31 DIAGNOSIS — I517 Cardiomegaly: Secondary | ICD-10-CM

## 2022-07-31 DIAGNOSIS — E782 Mixed hyperlipidemia: Secondary | ICD-10-CM

## 2022-07-31 DIAGNOSIS — R9439 Abnormal result of other cardiovascular function study: Secondary | ICD-10-CM

## 2022-07-31 DIAGNOSIS — Z01812 Encounter for preprocedural laboratory examination: Secondary | ICD-10-CM

## 2022-07-31 DIAGNOSIS — I10 Essential (primary) hypertension: Secondary | ICD-10-CM

## 2022-07-31 MED ORDER — AMLODIPINE BESYLATE 5 MG PO TABS
5.0000 mg | ORAL_TABLET | Freq: Every day | ORAL | 3 refills | Status: DC
  Filled 2022-07-31: qty 90, 90d supply, fill #0
  Filled 2022-11-09: qty 90, 90d supply, fill #1
  Filled 2023-03-11: qty 90, 90d supply, fill #2
  Filled 2023-07-30: qty 90, 90d supply, fill #3
  Filled ????-??-??: fill #2

## 2022-07-31 MED ORDER — METOPROLOL TARTRATE 100 MG PO TABS
ORAL_TABLET | ORAL | 0 refills | Status: DC
  Filled 2022-07-31: qty 1, 1d supply, fill #0

## 2022-07-31 NOTE — Progress Notes (Signed)
Cardiology Office Note:    Date:  07/31/2022   ID:  Shelby Taylor, DOB Nov 27, 1965, MRN [id number]  PCP:  [name], FNP  Cardiologist:  Berniece Salines, DO  Electrophysiologist:  None   Referring MD: [name], FNP   " I recently had a stress test"  History of Present Illness:    Shelby Taylor is a 57 y.o. female with a hx of hypertension, hyperlipidemia here today to be evaluated due to abnormal EKG and elevated Taylor pressure.  She tells me that she has was diagnosed with hypertension in 2010, since that time she has been on multiple antihypertensive medications including losartan which she had transition off of due to the recall, hydrochlorothiazide which they thought was causing thinning of her hair. She is currently on lisinopril 20 mg daily amlodipine 2.5 mg daily.  She tells me she really has not been taking amlodipine as prescribed and has recently started doing so.  Her visit is because she recently had a exercise tolerance test and she did have some EKG changes and was also reported to have persistent response to exercise.     Past Medical History:  Diagnosis Date   Hx of adenomatous colonic polyps 12/20/2019   Hypertension     Past Surgical History:  Procedure Laterality Date   COLONOSCOPY W/ POLYPECTOMY  11/2019   TOTAL ABDOMINAL HYSTERECTOMY W/ BILATERAL SALPINGOOPHORECTOMY  2007   Hx of Fibroids, ovaries removed 2/2 cysts and adhesions to uterus; no hx of abnormal pap smear    Current Medications: Current Meds  Medication Sig   amLODipine (NORVASC) 5 MG tablet Take 1 tablet (5 mg total) by mouth daily.   Cholecalciferol (VITAMIN D3) 50 MCG (2000 UT) TABS Take 50 mcg by mouth as directed.   lisinopril (ZESTRIL) 20 MG tablet Take 1 tablet (20 mg total) by mouth daily.   metoprolol tartrate (LOPRESSOR) 100 MG tablet Take 2 hours prior to CT   Multiple Vitamin (MULTIVITAMIN) tablet Take 1 tablet by mouth daily.   triamcinolone ointment (KENALOG) 0.5 %  Apply 1 Application topically 2 (two) times daily.   [DISCONTINUED] amLODipine (NORVASC) 2.5 MG tablet Take 1 tablet (2.5 mg total) by mouth daily.     Allergies:   Morphine and related and Other   Social History   Socioeconomic History   Marital status: Single    Spouse name: Not on file   Number of children: Not on file   Years of education: Not on file   Highest education level: Master's degree (e.g., MA, MS, MEng, MEd, MSW, MBA)  Occupational History   Occupation: Programmer, multimedia: [employer]    Comment: [place] [employer] [employer]  Tobacco Use   Smoking status: Never   Smokeless tobacco: Never  Vaping Use   Vaping Use: Never used  Substance and Sexual Activity   Alcohol use: Yes    Alcohol/week: 1.0 standard drink of alcohol    Types: 1 Glasses of wine per week    Comment: Social   Drug use: No   Sexual activity: Never  Other Topics Concern   Not on file  Social History Narrative   Not on file   Social Determinants of Health   Financial Resource Strain: Not on file  Food Insecurity: Not on file  Transportation Needs: Not on file  Physical Activity: Unknown (04/03/2017)   Exercise Vital Sign    Days of Exercise per Week: 1 day    Minutes of Exercise per Session: Not on file  Stress: Stress Concern Present (04/03/2017)   [place]    Feeling of Stress : Very much  Social Connections: Not on file     Family History: The patient's family history includes Cervical cancer (age of onset: 55) in her maternal aunt; Colon cancer in an other family member; Colon cancer (age of onset: 23) in her maternal aunt; Colon polyps in her mother; Diabetes in her maternal grandmother and mother; Hypertension in her maternal grandmother and mother; Prostate cancer (age of onset: 66) in her maternal grandfather. There is no history of Esophageal cancer, Rectal cancer, or Stomach cancer.  ROS:   Review of Systems  Constitution:  Negative for decreased appetite, fever and weight gain.  HENT: Negative for congestion, ear discharge, hoarse voice and sore throat.   Eyes: Negative for discharge, redness, vision loss in right eye and visual halos.  Cardiovascular: Negative for chest pain, dyspnea on exertion, leg swelling, orthopnea and palpitations.  Respiratory: Negative for cough, hemoptysis, shortness of breath and snoring.   Endocrine: Negative for heat intolerance and polyphagia.  Hematologic/Lymphatic: Negative for bleeding problem. Does not bruise/bleed easily.  Skin: Negative for flushing, nail changes, rash and suspicious lesions.  Musculoskeletal: Negative for arthritis, joint pain, muscle cramps, myalgias, neck pain and stiffness.  Gastrointestinal: Negative for abdominal pain, bowel incontinence, diarrhea and excessive appetite.  Genitourinary: Negative for decreased libido, genital sores and incomplete emptying.  Neurological: Negative for brief paralysis, focal weakness, headaches and loss of balance.  Psychiatric/Behavioral: Negative for altered mental status, depression and suicidal ideas.  Allergic/Immunologic: Negative for HIV exposure and persistent infections.    EKGs/Labs/Other Studies Reviewed:    The following studies were reviewed today:   EKG:  The ekg ordered today demonstrates sinus tachycardia. HR 106 bpm.  Exercise tolerance test July 04, 2022   The patient exercised according to the [facility] protocol for 6:18min achieving 7.0 METs.   Patient with resting hypertension 180/112   There is horizontal ST depression (II, III, aVF, V4, V5 and V6) was noted.   Electrocardiogram concerning for possible ischemia although patient was also very hypertensive which may be contributing to ST changes. Recommend further ischemic work-up with coronary CTA vs cath pending clinical symptoms.  Recent Labs: 08/22/2021: ALT 24; Hemoglobin 12.6; Platelets 258 01/10/2022: BUN 14; Creatinine, Ser 0.64; Potassium  4.1; Sodium 141  Recent Lipid Panel    Component Value Date/Time   CHOL 240 (H) 08/22/2021 1002   TRIG 61 08/22/2021 1002   HDL 65 08/22/2021 1002   CHOLHDL 3.7 08/22/2021 1002   LDLCALC 165 (H) 08/22/2021 1002    Physical Exam:    VS:  BP (!) 154/90 (BP Location: Right Arm)   Pulse (!) 106   Ht 5\' 4"  (1.626 m)   Wt 180 lb 9.6 oz (81.9 kg)   LMP 08/26/2017   SpO2 96%   BMI 31.00 kg/m     Wt Readings from Last 3 Encounters:  07/31/22 180 lb 9.6 oz (81.9 kg)  06/27/22 179 lb (81.2 kg)  01/10/22 183 lb (83 kg)     GEN: Well nourished, well developed in no acute distress HEENT: Normal NECK: No JVD; No carotid bruits LYMPHATICS: No lymphadenopathy CARDIAC: S1S2 noted,RRR, no murmurs, rubs, gallops RESPIRATORY:  Clear to auscultation without rales, wheezing or rhonchi  ABDOMEN: Soft, non-tender, non-distended, +bowel sounds, no guarding. EXTREMITIES: No edema, No cyanosis, no clubbing MUSCULOSKELETAL:  No deformity  SKIN: Warm and dry NEUROLOGIC:  Alert and  oriented x 3, non-focal PSYCHIATRIC:  Normal affect, good insight  ASSESSMENT:    1. Atrial enlargement, bilateral   2. Abnormal stress test   3. Pre-procedure lab exam   4. Mixed hyperlipidemia   5. Benign essential hypertension    PLAN:    She is hypertensive in the office today we will like to increase her amlodipine to 5 mg daily.  Will keep her on the lisinopril for now.  Her goal is to be on less antihypertensive medication.  We discussed potentially going up on the amlodipine as long as she tolerate this without any peripheral edema we can titrate her off the lisinopril monitor her Taylor pressure hopefully this can keep her less than 130/80.  But if it does not then we will consider using an ARB with likely valsartan.  She will take her Taylor pressure daily send me the information via MyChart.  In light of her abnormal stress test and her risk factors with hypertension, hyperlipidemia we will get coronary  CTA to assess for any coronary artery disease.  EKG showed evidence of biatrial enlargement with strain hypertension I like to get an echo to assess for any structural abnormalities.  Insert weight loss  The patient is in agreement with the above plan. The patient left the office in stable condition.  The patient will follow up in   Medication Adjustments/Labs and Tests Ordered: Current medicines are reviewed at length with the patient today.  Concerns regarding medicines are outlined above.  Orders Placed This Encounter  Procedures   CT CORONARY MORPH W/CTA COR W/SCORE W/CA W/CM &/OR WO/CM   Comprehensive Metabolic Panel (CMET)   Magnesium   ECHOCARDIOGRAM COMPLETE   Meds ordered this encounter  Medications   metoprolol tartrate (LOPRESSOR) 100 MG tablet    Sig: Take 2 hours prior to CT    Dispense:  1 tablet    Refill:  0   amLODipine (NORVASC) 5 MG tablet    Sig: Take 1 tablet (5 mg total) by mouth daily.    Dispense:  90 tablet    Refill:  3    Patient Instructions  Medication Instructions:  Your physician has recommended you make the following change in your medication:  INCREASE: Amlodipine 5 mg once daily  Please take your Taylor pressure daily for 2 weeks and send in a MyChart message. Please include heart rates.   HOW TO TAKE YOUR Taylor PRESSURE: Rest 5 minutes before taking your Taylor pressure. Don't smoke or drink caffeinated beverages for at least 30 minutes before. Take your Taylor pressure before (not after) you eat. Sit comfortably with your back supported and both feet on the floor (don't cross your legs). Elevate your arm to heart level on a table or a desk. Use the proper sized cuff. It should fit smoothly and snugly around your bare upper arm. There should be enough room to slip a fingertip under the cuff. The bottom edge of the cuff should be 1 inch above the crease of the elbow. Ideally, take 3 measurements at one sitting and record the average.  *If  you need a refill on your cardiac medications before your next appointment, please call your pharmacy*   Lab Work: Your physician recommends that you return for lab work in:  CMET, Mag If you have labs (Taylor work) drawn today and your tests are completely normal, you will receive your results only by: [facility] (if you have MyChart) OR A paper copy in the mail If  you have any lab test that is abnormal or we need to change your treatment, we will call you to review the results.   Testing/Procedures: Your physician has requested that you have an echocardiogram. Echocardiography is a painless test that uses sound waves to create images of your heart. It provides your doctor with information about the size and shape of your heart and how well your heart's chambers and valves are working. This procedure takes approximately one hour. There are no restrictions for this procedure. Please do NOT wear cologne, perfume, aftershave, or lotions (deodorant is allowed). Please arrive 15 minutes prior to your appointment time.    Your cardiac CT will be scheduled at one of the below locations:   [facility] [address] [place], [place] [address] [phone]   If scheduled at [facility], please arrive at the [facility] and Children's Entrance (Entrance C2) of [facility] 30 minutes prior to test start time. You can use the FREE valet parking offered at entrance C (encouraged to control the heart rate for the test)  Proceed to the [facility] Radiology Department (first floor) to check-in and test prep.  All radiology patients and guests should use entrance C2 at [facility], accessed from [facility], even though the hospital's physical address listed is [address].      Please follow these instructions carefully (unless otherwise directed):   On the Night Before the Test: Be sure to Drink plenty of water. Do not consume  any caffeinated/decaffeinated beverages or chocolate 12 hours prior to your test. Do not take any antihistamines 12 hours prior to your test.  On the Day of the Test: Drink plenty of water until 1 hour prior to the test. Do not eat any food 1 hour prior to test. You may take your regular medications prior to the test.  Take metoprolol (Lopressor) two hours prior to test. If you take Furosemide/Hydrochlorothiazide/Spironolactone, please HOLD on the morning of the test. FEMALES- please wear underwire-free bra if available, avoid dresses & tight clothing        After the Test: Drink plenty of water. After receiving IV contrast, you may experience a mild flushed feeling. This is normal. On occasion, you may experience a mild rash up to 24 hours after the test. This is not dangerous. If this occurs, you can take Benadryl 25 mg and increase your fluid intake. If you experience trouble breathing, this can be serious. If it is severe call 911 IMMEDIATELY. If it is mild, please call our office. If you take any of these medications: Glipizide/Metformin, Avandament, Glucavance, please do not take 48 hours after completing test unless otherwise instructed.  We will call to schedule your test 2-4 weeks out understanding that some insurance companies will need an authorization prior to the service being performed.   For non-scheduling related questions, please contact the cardiac imaging nurse navigator should you have any questions/concerns: Marchia Bond, Cardiac Imaging Nurse Navigator Gordy Clement, Cardiac Imaging Nurse Navigator [facility] Direct Office Dial: [phone]   For scheduling needs, including cancellations and rescheduling, please call [place], [phone].    Follow-Up: At [facility], you and your health needs are our priority.  As part of our continuing mission to provide you with exceptional heart care, we have created designated  Provider Care Teams.  These Care Teams include your primary Cardiologist (physician) and Advanced Practice Providers (APPs -  Physician Assistants and  Nurse Practitioners) who all work together to provide you with the care you need, when you need it.  We recommend signing up for the patient portal called "MyChart".  Sign up information is provided on this After Visit Summary.  MyChart is used to connect with patients for Virtual Visits (Telemedicine).  Patients are able to view lab/test results, encounter notes, upcoming appointments, etc.  Non-urgent messages can be sent to your provider as well.   To learn more about what you can do with MyChart, go to [email or web address].    Your next appointment:   4 month(s)  Provider:   Berniece Salines, DO      Adopting a Healthy Lifestyle.  Know what a healthy weight is for you (roughly BMI <25) and aim to maintain this   Aim for 7+ servings of fruits and vegetables daily   65-80+ fluid ounces of water or unsweet tea for healthy kidneys   Limit to max 1 drink of alcohol per day; avoid smoking/tobacco   Limit animal fats in diet for cholesterol and heart health - choose grass fed whenever available   Avoid highly processed foods, and foods high in saturated/trans fats   Aim for low stress - take time to unwind and care for your mental health   Aim for 150 min of moderate intensity exercise weekly for heart health, and weights twice weekly for bone health   Aim for 7-9 hours of sleep daily   When it comes to diets, agreement about the perfect plan isnt easy to find, even among the experts. Experts at the [employer] developed an idea known as the Healthy Eating Plate. Just imagine a plate divided into logical, healthy portions.   The emphasis is on diet quality:   Load up on vegetables and fruits - one-half of your plate: Aim for color and variety, and remember that potatoes dont count.   Go for whole grains -  one-quarter of your plate: Whole wheat, barley, wheat berries, quinoa, oats, brown rice, and foods made with them. If you want pasta, go with whole wheat pasta.   Protein power - one-quarter of your plate: Fish, chicken, beans, and nuts are all healthy, versatile protein sources. Limit red meat.   The diet, however, does go beyond the plate, offering a few other suggestions.   Use healthy plant oils, such as olive, canola, soy, corn, sunflower and peanut. Check the labels, and avoid partially hydrogenated oil, which have unhealthy trans fats.   If youre thirsty, drink water. Coffee and tea are good in moderation, but skip sugary drinks and limit milk and dairy products to one or two daily servings.   The type of carbohydrate in the diet is more important than the amount. Some sources of carbohydrates, such as vegetables, fruits, whole grains, and beans-are healthier than others.   Finally, stay active  Signed, Berniece Salines, DO  07/31/2022 1:56 PM    [facility]

## 2022-07-31 NOTE — Patient Instructions (Signed)
Medication Instructions:  Your physician has recommended you make the following change in your medication:  INCREASE: Amlodipine 5 mg once daily  Please take your blood pressure daily for 2 weeks and send in a MyChart message. Please include heart rates.   HOW TO TAKE YOUR BLOOD PRESSURE: Rest 5 minutes before taking your blood pressure. Don't smoke or drink caffeinated beverages for at least 30 minutes before. Take your blood pressure before (not after) you eat. Sit comfortably with your back supported and both feet on the floor (don't cross your legs). Elevate your arm to heart level on a table or a desk. Use the proper sized cuff. It should fit smoothly and snugly around your bare upper arm. There should be enough room to slip a fingertip under the cuff. The bottom edge of the cuff should be 1 inch above the crease of the elbow. Ideally, take 3 measurements at one sitting and record the average.  *If you need a refill on your cardiac medications before your next appointment, please call your pharmacy*   Lab Work: Your physician recommends that you return for lab work in:  CMET, Mag If you have labs (blood work) drawn today and your tests are completely normal, you will receive your results only by: MyChart Message (if you have MyChart) OR A paper copy in the mail If you have any lab test that is abnormal or we need to change your treatment, we will call you to review the results.   Testing/Procedures: Your physician has requested that you have an echocardiogram. Echocardiography is a painless test that uses sound waves to create images of your heart. It provides your doctor with information about the size and shape of your heart and how well your heart's chambers and valves are working. This procedure takes approximately one hour. There are no restrictions for this procedure. Please do NOT wear cologne, perfume, aftershave, or lotions (deodorant is allowed). Please arrive 15 minutes  prior to your appointment time.    Your cardiac CT will be scheduled at one of the below locations:   [facility] [address] [place], [place] [address] [phone]   If scheduled at [facility], please arrive at the [facility] (Entrance C2) of [facility] 30 minutes prior to test start time. You can use the FREE valet parking offered at entrance C (encouraged to control the heart rate for the test)  Proceed to the [facility] Radiology Department (first floor) to check-in and test prep.  All radiology patients and guests should use entrance C2 at [facility], accessed from [facility], even though the hospital's physical address listed is [address].      Please follow these instructions carefully (unless otherwise directed):   On the Night Before the Test: Be sure to Drink plenty of water. Do not consume any caffeinated/decaffeinated beverages or chocolate 12 hours prior to your test. Do not take any antihistamines 12 hours prior to your test.  On the Day of the Test: Drink plenty of water until 1 hour prior to the test. Do not eat any food 1 hour prior to test. You may take your regular medications prior to the test.  Take metoprolol (Lopressor) two hours prior to test. If you take Furosemide/Hydrochlorothiazide/Spironolactone, please HOLD on the morning of the test. FEMALES- please wear underwire-free bra if available, avoid dresses & tight clothing        After the Test: Drink  plenty of water. After receiving IV contrast, you may experience a mild flushed feeling. This is normal. On occasion, you may experience a mild rash up to 24 hours after the test. This is not dangerous. If this occurs, you can take Benadryl 25 mg and increase your fluid intake. If you experience trouble breathing, this can be serious. If it is severe call 911 IMMEDIATELY. If it is mild, please call our  office. If you take any of these medications: Glipizide/Metformin, Avandament, Glucavance, please do not take 48 hours after completing test unless otherwise instructed.  We will call to schedule your test 2-4 weeks out understanding that some insurance companies will need an authorization prior to the service being performed.   For non-scheduling related questions, please contact the cardiac imaging nurse navigator should you have any questions/concerns: Marchia Bond, Cardiac Imaging Nurse Navigator Gordy Clement, Cardiac Imaging Nurse Navigator [facility] Direct Office Dial: [phone]   For scheduling needs, including cancellations and rescheduling, please call [place], [phone].    Follow-Up: At [facility], you and your health needs are our priority.  As part of our continuing mission to provide you with exceptional heart care, we have created designated Provider Care Teams.  These Care Teams include your primary Cardiologist (physician) and Advanced Practice Providers (APPs -  Physician Assistants and Nurse Practitioners) who all work together to provide you with the care you need, when you need it.  We recommend signing up for the patient portal called "MyChart".  Sign up information is provided on this After Visit Summary.  MyChart is used to connect with patients for Virtual Visits (Telemedicine).  Patients are able to view lab/test results, encounter notes, upcoming appointments, etc.  Non-urgent messages can be sent to your provider as well.   To learn more about what you can do with MyChart, go to [email or web address].    Your next appointment:   4 month(s)  Provider:   Berniece Salines, DO

## 2022-08-01 ENCOUNTER — Telehealth (HOSPITAL_BASED_OUTPATIENT_CLINIC_OR_DEPARTMENT_OTHER): Payer: Self-pay | Admitting: Licensed Clinical Social Worker

## 2022-08-01 LAB — COMPREHENSIVE METABOLIC PANEL
ALT: 17 IU/L (ref 0–32)
AST: 16 IU/L (ref 0–40)
Albumin/Globulin Ratio: 1.9 (ref 1.2–2.2)
Albumin: 4.5 g/dL (ref 3.8–4.9)
Alkaline Phosphatase: 107 IU/L (ref 44–121)
BUN/Creatinine Ratio: 19 (ref 9–23)
BUN: 13 mg/dL (ref 6–24)
Bilirubin Total: 0.4 mg/dL (ref 0.0–1.2)
CO2: 24 mmol/L (ref 20–29)
Calcium: 9.5 mg/dL (ref 8.7–10.2)
Chloride: 104 mmol/L (ref 96–106)
Creatinine, Ser: 0.7 mg/dL (ref 0.57–1.00)
Globulin, Total: 2.4 g/dL (ref 1.5–4.5)
Glucose: 97 mg/dL (ref 70–99)
Potassium: 4.5 mmol/L (ref 3.5–5.2)
Sodium: 143 mmol/L (ref 134–144)
Total Protein: 6.9 g/dL (ref 6.0–8.5)
eGFR: 101 mL/min/{1.73_m2} (ref >59)

## 2022-08-01 LAB — MAGNESIUM: Magnesium: 2.2 mg/dL (ref 1.6–2.3)

## 2022-08-01 NOTE — Addendum Note (Signed)
Addended by: Brantley Persons A on: 08/01/2022 03:10 PM   Modules accepted: Orders

## 2022-08-01 NOTE — Progress Notes (Signed)
Heart and Vascular Care Navigation  08/01/2022  Shelby Taylor [birth year] [id number]  Reason for Referral: populated as uninsured- now indicating possible insurance Patient is participating in a Managed Medicaid Plan:No, family planning only  Engaged with patient by telephone for initial visit for Heart and Vascular Care Coordination.                                                                                                   Assessment:                                     LCSW noted pt uninsured, but when encounter created appears may be some commercial coverage. I called her at [phone]. Introduced self, role, reason for call. Pt confirmed home address, PCP. 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 We discussed caseworkers are available at [employer]- she took down information. She hasn't applied recently and depending on income she may be eligible at this time. If pt does not have commercial plan can apply for CAFA if ineligible for full Medicaid. If she does have commercial plan then she would have to have bills totally >$5,000 for assistance- I will mail her that information. Otherwise no concerns or questions at this time regarding any additional cost of living questions. I remain available if needed.   HRT/VAS Care Coordination     Patients Home Cardiology Office [place] Team Social Worker   Social Worker Name: Westley Hummer, [place], [place]   Living arrangements for the past 2 months Single Family Home   Lives with: Self   Patient Current Insurance Coverage Commercial Insurance  Medicaid Family Planning   Patient Has Concern With Paying Medical Bills Yes   Medical Bill Referrals: [facility]   Does Patient Have Prescription Coverage? No       Social History:                                                                             SDOH Screenings   Food Insecurity: No Food  Insecurity (08/01/2022)  Housing: Low Risk  (08/01/2022)  Transportation Needs: No Transportation Needs (08/01/2022)  Utilities: Not At Risk (08/01/2022)  Depression (PHQ2-9): Low Risk  (06/27/2022)  Financial Resource Strain: Low Risk  (08/01/2022)  Physical Activity: Unknown (04/03/2017)  Stress: Stress Concern Present (04/03/2017)  Tobacco Use: Low Risk  (07/31/2022)    SDOH Interventions: Financial Resources:  Financial Strain Interventions: Other (Comment) (discussed Medicaid counselors at [employer]; mailed CAFA) Museum/gallery curator Counseling for [place] Insecurity:  Food Insecurity Interventions: Intervention Not Indicated  Housing Insecurity:  Housing Interventions: Intervention Not Indicated  Transportation:   Transportation Interventions:  Intervention Not Indicated     Other Care Navigation Interventions:     Provided Pharmacy assistance resources  Pt denies any issues obtaining or affording medications at this time.    Follow-up plan:   LCSW has mailed pt the application for [facility], encouraged her to let me know if I can be of any additional assistance.

## 2022-08-07 ENCOUNTER — Telehealth (HOSPITAL_COMMUNITY): Payer: Self-pay | Admitting: Emergency Medicine

## 2022-08-07 NOTE — Telephone Encounter (Signed)
Attempted to call patient regarding upcoming cardiac CT appointment. °Left message on voicemail with name and callback number °Skylier Kretschmer RN Navigator Cardiac Imaging °[facility] °[phone] Office °[phone] Cell ° °

## 2022-08-07 NOTE — Telephone Encounter (Signed)
Reaching out to patient to offer assistance regarding upcoming cardiac imaging study; pt verbalizes understanding of appt date/time, parking situation and where to check in, pre-test NPO status and medications ordered, and verified current allergies; name and call back number provided for further questions should they arise Rockwell Alexandria RN Navigator Cardiac Imaging [facility] [phone] office [phone] cell  Arrival [facility] radiology 100mg  metoprolol tartrate Denies iv issues Aware contrast/nitro

## 2022-08-08 ENCOUNTER — Ambulatory Visit (HOSPITAL_BASED_OUTPATIENT_CLINIC_OR_DEPARTMENT_OTHER)
Admission: RE | Admit: 2022-08-08 | Discharge: 2022-08-08 | Disposition: A | Discharge status: Home / Self Care | Payer: Self-pay | Source: Ambulatory Visit | Attending: Cardiology | Admitting: Cardiology

## 2022-08-08 ENCOUNTER — Other Ambulatory Visit: Payer: Self-pay | Admitting: Cardiology

## 2022-08-08 ENCOUNTER — Ambulatory Visit (HOSPITAL_COMMUNITY)
Admission: RE | Admit: 2022-08-08 | Discharge: 2022-08-08 | Disposition: A | Discharge status: Home / Self Care | Payer: Self-pay | Source: Ambulatory Visit | Attending: Cardiology | Admitting: Cardiology

## 2022-08-08 DIAGNOSIS — R9439 Abnormal result of other cardiovascular function study: Secondary | ICD-10-CM | POA: Insufficient documentation

## 2022-08-08 DIAGNOSIS — R931 Abnormal findings on diagnostic imaging of heart and coronary circulation: Secondary | ICD-10-CM

## 2022-08-08 DIAGNOSIS — I2510 Atherosclerotic heart disease of native coronary artery without angina pectoris: Secondary | ICD-10-CM

## 2022-08-08 MED ORDER — METOPROLOL TARTRATE 5 MG/5ML IV SOLN
INTRAVENOUS | Status: AC
  Filled 2022-08-08: qty 10

## 2022-08-08 MED ORDER — METOPROLOL TARTRATE 5 MG/5ML IV SOLN
10.0000 mg | INTRAVENOUS | Status: DC | PRN
  Administered 2022-08-08: 10 mg via INTRAVENOUS

## 2022-08-08 MED ORDER — NITROGLYCERIN 0.4 MG SL SUBL
0.8000 mg | SUBLINGUAL_TABLET | Freq: Once | SUBLINGUAL | Status: AC
  Administered 2022-08-08: 0.8 mg via SUBLINGUAL

## 2022-08-08 MED ORDER — NITROGLYCERIN 0.4 MG SL SUBL
SUBLINGUAL_TABLET | SUBLINGUAL | Status: AC
  Filled 2022-08-08: qty 2

## 2022-08-08 MED ORDER — IOHEXOL 350 MG/ML SOLN
100.0000 mL | Freq: Once | INTRAVENOUS | Status: AC | PRN
  Administered 2022-08-08: 100 mL via INTRAVENOUS

## 2022-08-10 ENCOUNTER — Ambulatory Visit: Payer: Medicaid Other | Attending: Cardiology | Admitting: Cardiology

## 2022-08-10 ENCOUNTER — Encounter: Payer: Self-pay | Admitting: Cardiology

## 2022-08-10 VITALS — BP 128/78 | HR 92 | Ht 64.0 in | Wt 176.6 lb

## 2022-08-10 DIAGNOSIS — E782 Mixed hyperlipidemia: Secondary | ICD-10-CM

## 2022-08-10 DIAGNOSIS — I2510 Atherosclerotic heart disease of native coronary artery without angina pectoris: Secondary | ICD-10-CM

## 2022-08-10 DIAGNOSIS — Z01812 Encounter for preprocedural laboratory examination: Secondary | ICD-10-CM

## 2022-08-10 DIAGNOSIS — E669 Obesity, unspecified: Secondary | ICD-10-CM

## 2022-08-10 DIAGNOSIS — I10 Essential (primary) hypertension: Secondary | ICD-10-CM

## 2022-08-10 NOTE — Patient Instructions (Addendum)
Medication Instructions:  Your physician recommends that you continue on your current medications as directed. Please refer to the Current Medication list given to you today.  *If you need a refill on your cardiac medications before your next appointment, please call your pharmacy*   Lab Work: Your physician recommends that you have labs drawn today: CBC If you have labs (blood work) drawn today and your tests are completely normal, you will receive your results only by: MyChart Message (if you have MyChart) OR A paper copy in the mail If you have any lab test that is abnormal or we need to change your treatment, we will call you to review the results.   Testing/Procedures  You are scheduled for a Cardiac Catheterization on Tuesday, April 16 with Dr. Nicki Guadalajara.  1. Please arrive at the [facility] (Main Entrance A) at [facility]: [address] [place], [place] [address] at 7:00 AM (This time is two hours before your procedure to ensure your preparation). Free valet parking service is available. You will check in at ADMITTING. The support person will be asked to wait in the waiting room.  It is OK to have someone drop you off and come back when you are ready to be discharged.    Special note: Every effort is made to have your procedure done on time. Please understand that emergencies sometimes delay scheduled procedures.  2. Diet: Do not eat solid foods after midnight.  The patient may have clear liquids until 5am upon the day of the procedure.  3. Labs: You will need to have blood drawn on TODAY 4. Medication instructions in preparation for your procedure:   Contrast Allergy: No  On the morning of your procedure, take your Aspirin 81 mg and any morning medicines NOT listed above.  You may use sips of water.  5. Plan to go home the same day, you will only stay overnight if medically necessary. 6. Bring a current list of your medications and current insurance cards. 7. You  MUST have a responsible person to drive you home. 8. Someone MUST be with you the first 24 hours after you arrive home or your discharge will be delayed. 9. Please wear clothes that are easy to get on and off and wear slip-on shoes.  Thank you for allowing [place] to care for you!   -- [facility]    Follow-Up: At [facility], you and your health needs are our priority.  As part of our continuing mission to provide you with exceptional heart care, we have created designated Provider Care Teams.  These Care Teams include your primary Cardiologist (physician) and Advanced Practice Providers (APPs -  Physician Assistants and Nurse Practitioners) who all work together to provide you with the care you need, when you need it.  We recommend signing up for the patient portal called "MyChart".  Sign up information is provided on this After Visit Summary.  MyChart is used to connect with patients for Virtual Visits (Telemedicine).  Patients are able to view lab/test results, encounter notes, upcoming appointments, etc.  Non-urgent messages can be sent to your provider as well.   To learn more about what you can do with MyChart, go to [email or web address].    Your next appointment:   3-4 week(s) post cath (double book okay)   Provider:   Thomasene Ripple, DO

## 2022-08-10 NOTE — H&P (View-Only) (Signed)
Cardiology Office Note:    Date:  08/11/2022   ID:  De Burrs, DOB 05/23/1965, MRN [id number]  PCP:  Arnette Felts, FNP  Cardiologist:  Thomasene Ripple, DO  Electrophysiologist:  None   Referring MD: Arnette Felts, FNP   " I recently had a stress test"  History of Present Illness:    Shelby Taylor is a 57 y.o. female with a hx of hypertension, hyperlipidemia here today for follow-up visit.  At her initial visit she was sent due to abnormal stress test. During the visit I sent the patient for coronary CTA.  She is here today to discuss result.  She was also hypertensive I increase her amlodipine to 5 mg daily and continue the patient on her lisinopril.  She was we will get her coronary CTA and is here today to discuss her result.    Past Medical History:  Diagnosis Date   Hx of adenomatous colonic polyps 12/20/2019   Hypertension     Past Surgical History:  Procedure Laterality Date   COLONOSCOPY W/ POLYPECTOMY  11/2019   TOTAL ABDOMINAL HYSTERECTOMY W/ BILATERAL SALPINGOOPHORECTOMY  2007   Hx of Fibroids, ovaries removed 2/2 cysts and adhesions to uterus; no hx of abnormal pap smear    Current Medications: Current Meds  Medication Sig   amLODipine (NORVASC) 5 MG tablet Take 1 tablet (5 mg total) by mouth daily.   Cholecalciferol (VITAMIN D3) 50 MCG (2000 UT) TABS Take 50 mcg by mouth as directed.   lisinopril (ZESTRIL) 20 MG tablet Take 1 tablet (20 mg total) by mouth daily.   Multiple Vitamin (MULTIVITAMIN) tablet Take 1 tablet by mouth daily.   triamcinolone ointment (KENALOG) 0.5 % Apply 1 Application topically 2 (two) times daily.   [DISCONTINUED] metoprolol tartrate (LOPRESSOR) 100 MG tablet Take 2 hours prior to CT     Allergies:   Morphine and related and Other   Social History   Socioeconomic History   Marital status: Single    Spouse name: Not on file   Number of children: Not on file   Years of education: Not on file   Highest education level:  Master's degree (e.g., MA, MS, MEng, MEd, MSW, MBA)  Occupational History   Occupation: Teacher, adult education: [employer]    Comment: 4 EAst [employer]  Tobacco Use   Smoking status: Never   Smokeless tobacco: Never  Vaping Use   Vaping Use: Never used  Substance and Sexual Activity   Alcohol use: Yes    Alcohol/week: 1.0 standard drink of alcohol    Types: 1 Glasses of wine per week    Comment: Social   Drug use: No   Sexual activity: Never  Other Topics Concern   Not on file  Social History Narrative   Not on file   Social Determinants of Health   Financial Resource Strain: Low Risk  (08/01/2022)   Overall Financial Resource Strain (CARDIA)    Difficulty of Paying Living Expenses: Not very hard  Food Insecurity: No Food Insecurity (08/01/2022)   Hunger Vital Sign    Worried About Running Out of Food in the Last Year: Never true    Ran Out of Food in the Last Year: Never true  Transportation Needs: No Transportation Needs (08/01/2022)   PRAPARE - Administrator, Civil Service (Medical): No    Lack of Transportation (Non-Medical): No  Physical Activity: Unknown (04/03/2017)   Exercise Vital Sign    Days of Exercise per Week:  1 day    Minutes of Exercise per Session: Not on file  Stress: Stress Concern Present (04/03/2017)   [employer] of Occupational Health - Occupational Stress Questionnaire    Feeling of Stress : Very much  Social Connections: Not on file     Family History: The patient's family history includes Cervical cancer (age of onset: 62) in her maternal aunt; Colon cancer in an other family member; Colon cancer (age of onset: 7) in her maternal aunt; Colon polyps in her mother; Diabetes in her maternal grandmother and mother; Hypertension in her maternal grandmother and mother; Prostate cancer (age of onset: 41) in her maternal grandfather. There is no history of Esophageal cancer, Rectal cancer, or Stomach cancer.  ROS:   Review of Systems  Constitution:  Negative for decreased appetite, fever and weight gain.  HENT: Negative for congestion, ear discharge, hoarse voice and sore throat.   Eyes: Negative for discharge, redness, vision loss in right eye and visual halos.  Cardiovascular: Negative for chest pain, dyspnea on exertion, leg swelling, orthopnea and palpitations.  Respiratory: Negative for cough, hemoptysis, shortness of breath and snoring.   Endocrine: Negative for heat intolerance and polyphagia.  Hematologic/Lymphatic: Negative for bleeding problem. Does not bruise/bleed easily.  Skin: Negative for flushing, nail changes, rash and suspicious lesions.  Musculoskeletal: Negative for arthritis, joint pain, muscle cramps, myalgias, neck pain and stiffness.  Gastrointestinal: Negative for abdominal pain, bowel incontinence, diarrhea and excessive appetite.  Genitourinary: Negative for decreased libido, genital sores and incomplete emptying.  Neurological: Negative for brief paralysis, focal weakness, headaches and loss of balance.  Psychiatric/Behavioral: Negative for altered mental status, depression and suicidal ideas.  Allergic/Immunologic: Negative for HIV exposure and persistent infections.    EKGs/Labs/Other Studies Reviewed:    The following studies were reviewed today:   EKG:  The ekg ordered today demonstrates sinus tachycardia. HR 106 bpm.  Exercise tolerance test July 04, 2022   The patient exercised according to the [facility] protocol for 6:24min achieving 7.0 METs.   Patient with resting hypertension 180/112   There is horizontal ST depression (II, III, aVF, V4, V5 and V6) was noted.   Electrocardiogram concerning for possible ischemia although patient was also very hypertensive which may be contributing to ST changes. Recommend further ischemic work-up with coronary CTA vs cath pending clinical symptoms.  Recent Labs: 07/31/2022: ALT 17; BUN 13; Creatinine, Ser 0.70; Magnesium 2.2; Potassium 4.5; Sodium 143 08/10/2022:  Hemoglobin 12.7; Platelets 260  Recent Lipid Panel    Component Value Date/Time   CHOL 240 (H) 08/22/2021 1002   TRIG 61 08/22/2021 1002   HDL 65 08/22/2021 1002   CHOLHDL 3.7 08/22/2021 1002   LDLCALC 165 (H) 08/22/2021 1002    Physical Exam:    VS:  BP 128/78   Pulse 92   Ht  (1.626 m)   Wt 176 lb 9.6 oz (80.1 kg)   LMP 08/26/2017   SpO2 98%   BMI 30.31 kg/m     Wt Readings from Last 3 Encounters:  08/10/22 176 lb 9.6 oz (80.1 kg)  07/31/22 180 lb 9.6 oz (81.9 kg)  06/27/22 179 lb (81.2 kg)     GEN: Well nourished, well developed in no acute distress HEENT: Normal NECK: No JVD; No carotid bruits LYMPHATICS: No lymphadenopathy CARDIAC: S1S2 noted,RRR, no murmurs, rubs, gallops RESPIRATORY:  Clear to auscultation without rales, wheezing or rhonchi  ABDOMEN: Soft, non-tender, non-distended, +bowel sounds, no guarding. EXTREMITIES: No edema, No cyanosis, no clubbing MUSCULOSKELETAL:  No deformity  SKIN: Warm and dry NEUROLOGIC:  Alert and oriented x 3, non-focal PSYCHIATRIC:  Normal affect, good insight  ASSESSMENT:    1. Pre-procedure lab exam   2. Coronary artery disease involving native coronary artery of native heart, unspecified whether angina present   3. Benign essential hypertension   4. Mixed hyperlipidemia   5. Obesity (BMI 30-39.9)     PLAN:    Her coronary CT scan does show evidence of a left main mild disease which on CT FFR is suggesting abnormal flow.  With her history of ischemia on her recent stress echocardiogram the best thing for this patient with her risk factors as well as to get a left heart catheterization.  The patient understands that risks include but are not limited to stroke (1 in 1000), death (1 in 1000), kidney failure [usually temporary] (1 in 500), bleeding (1 in 200), allergic reaction [possibly serious] (1 in 200), and agrees to proceed.  Blood pressure is within normal   The patient is in agreement with the above plan.  The patient left the office in stable condition.  The patient will follow up in   Medication Adjustments/Labs and Tests Ordered: Current medicines are reviewed at length with the patient today.  Concerns regarding medicines are outlined above.  Orders Placed This Encounter  Procedures   CBC with Differential/Platelet   No orders of the defined types were placed in this encounter.   Patient Instructions  Medication Instructions:  Your physician recommends that you continue on your current medications as directed. Please refer to the Current Medication list given to you today.  *If you need a refill on your cardiac medications before your next appointment, please call your pharmacy*   Lab Work: Your physician recommends that you have labs drawn today: CBC If you have labs (blood work) drawn today and your tests are completely normal, you will receive your results only by: MyChart Message (if you have MyChart) OR A paper copy in the mail If you have any lab test that is abnormal or we need to change your treatment, we will call you to review the results.   Testing/Procedures  You are scheduled for a Cardiac Catheterization on Tuesday, April 16 with Dr. Nicki Guadalajara.  1. Please arrive at the [facility] (Main Entrance A) at [facility]: [address] [place], [place] [address] at 7:00 AM (This time is two hours before your procedure to ensure your preparation). Free valet parking service is available. You will check in at ADMITTING. The support person will be asked to wait in the waiting room.  It is OK to have someone drop you off and come back when you are ready to be discharged.    Special note: Every effort is made to have your procedure done on time. Please understand that emergencies sometimes delay scheduled procedures.  2. Diet: Do not eat solid foods after midnight.  The patient may have clear liquids until 5am upon the day of the procedure.  3. Labs: You will need  to have blood drawn on TODAY 4. Medication instructions in preparation for your procedure:   Contrast Allergy: No  On the morning of your procedure, take your Aspirin 81 mg and any morning medicines NOT listed above.  You may use sips of water.  5. Plan to go home the same day, you will only stay overnight if medically necessary. 6. Bring a current list of your medications and current insurance cards. 7. You  MUST have a responsible person to drive you home. 8. Someone MUST be with you the first 24 hours after you arrive home or your discharge will be delayed. 9. Please wear clothes that are easy to get on and off and wear slip-on shoes.  Thank you for allowing [place] to care for you!   -- [facility]    Follow-Up: At [facility], you and your health needs are our priority.  As part of our continuing mission to provide you with exceptional heart care, we have created designated Provider Care Teams.  These Care Teams include your primary Cardiologist (physician) and Advanced Practice Providers (APPs -  Physician Assistants and Nurse Practitioners) who all work together to provide you with the care you need, when you need it.  We recommend signing up for the patient portal called "MyChart".  Sign up information is provided on this After Visit Summary.  MyChart is used to connect with patients for Virtual Visits (Telemedicine).  Patients are able to view lab/test results, encounter notes, upcoming appointments, etc.  Non-urgent messages can be sent to your provider as well.   To learn more about what you can do with MyChart, go to [email or web address].    Your next appointment:   3-4 week(s) post cath (double book okay)   Provider:   Thomasene Ripple, DO      Adopting a Healthy Lifestyle.  Know what a healthy weight is for you (roughly BMI <25) and aim to maintain this   Aim for 7+ servings of fruits and vegetables daily   65-80+ fluid ounces of  water or unsweet tea for healthy kidneys   Limit to max 1 drink of alcohol per day; avoid smoking/tobacco   Limit animal fats in diet for cholesterol and heart health - choose grass fed whenever available   Avoid highly processed foods, and foods high in saturated/trans fats   Aim for low stress - take time to unwind and care for your mental health   Aim for 150 min of moderate intensity exercise weekly for heart health, and weights twice weekly for bone health   Aim for 7-9 hours of sleep daily   When it comes to diets, agreement about the perfect plan isnt easy to find, even among the experts. Experts at the [facility] [employer] developed an idea known as the Healthy Eating Plate. Just imagine a plate divided into logical, healthy portions.   The emphasis is on diet quality:   Load up on vegetables and fruits - one-half of your plate: Aim for color and variety, and remember that potatoes dont count.   Go for whole grains - one-quarter of your plate: Whole wheat, barley, wheat berries, quinoa, oats, brown rice, and foods made with them. If you want pasta, go with whole wheat pasta.   Protein power - one-quarter of your plate: Fish, chicken, beans, and nuts are all healthy, versatile protein sources. Limit red meat.   The diet, however, does go beyond the plate, offering a few other suggestions.   Use healthy plant oils, such as olive, canola, soy, corn, sunflower and peanut. Check the labels, and avoid partially hydrogenated oil, which have unhealthy trans fats.   If youre thirsty, drink water. Coffee and tea are good in moderation, but skip sugary drinks and limit milk and dairy products to one or two daily servings.   The type of carbohydrate in the diet is more important than the amount. Some sources of  carbohydrates, such as vegetables, fruits, whole grains, and beans-are healthier than others.   Finally, stay active  Signed, Thomasene Ripple, DO  08/11/2022 4:58 PM      [facility]

## 2022-08-10 NOTE — Progress Notes (Signed)
Cardiology Office Note:    Date:  08/11/2022   ID:  Shelby Taylor, DOB 02/28/1966, MRN [id number]  PCP:  Moore, Janece, FNP  Cardiologist:  Margery Szostak, DO  Electrophysiologist:  None   Referring MD: Moore, Janece, FNP   " I recently had a stress test"  History of Present Illness:    Shelby Taylor is a 56 y.o. female with a hx of hypertension, hyperlipidemia here today for follow-up visit.  At her initial visit she was sent due to abnormal stress test. During the visit I sent the patient for coronary CTA.  She is here today to discuss result.  She was also hypertensive I increase her amlodipine to 5 mg daily and continue the patient on her lisinopril.  She was we will get her coronary CTA and is here today to discuss her result.    Past Medical History:  Diagnosis Date   Hx of adenomatous colonic polyps 12/20/2019   Hypertension     Past Surgical History:  Procedure Laterality Date   COLONOSCOPY W/ POLYPECTOMY  11/2019   TOTAL ABDOMINAL HYSTERECTOMY W/ BILATERAL SALPINGOOPHORECTOMY  2007   Hx of Fibroids, ovaries removed 2/2 cysts and adhesions to uterus; no hx of abnormal pap smear    Current Medications: Current Meds  Medication Sig   amLODipine (NORVASC) 5 MG tablet Take 1 tablet (5 mg total) by mouth daily.   Cholecalciferol (VITAMIN D3) 50 MCG (2000 UT) TABS Take 50 mcg by mouth as directed.   lisinopril (ZESTRIL) 20 MG tablet Take 1 tablet (20 mg total) by mouth daily.   Multiple Vitamin (MULTIVITAMIN) tablet Take 1 tablet by mouth daily.   triamcinolone ointment (KENALOG) 0.5 % Apply 1 Application topically 2 (two) times daily.   [DISCONTINUED] metoprolol tartrate (LOPRESSOR) 100 MG tablet Take 2 hours prior to CT     Allergies:   Morphine and related and Other   Social History   Socioeconomic History   Marital status: Single    Spouse name: Not on file   Number of children: Not on file   Years of education: Not on file   Highest education level:  Master's degree (e.g., MA, MS, MEng, MEd, MSW, MBA)  Occupational History   Occupation: RN    Employer: [employer]    Comment: [employer]  Tobacco Use   Smoking status: Never   Smokeless tobacco: Never  Vaping Use   Vaping Use: Never used  Substance and Sexual Activity   Alcohol use: Yes    Alcohol/week: 1.0 standard drink of alcohol    Types: 1 Glasses of wine per week    Comment: Social   Drug use: No   Sexual activity: Never  Other Topics Concern   Not on file  Social History Narrative   Not on file   Social Determinants of Health   Financial Resource Strain: Low Risk  (08/01/2022)   Overall Financial Resource Strain (CARDIA)    Difficulty of Paying Living Expenses: Not very hard  Food Insecurity: No Food Insecurity (08/01/2022)   Hunger Vital Sign    Worried About Running Out of Food in the Last Year: Never true    Ran Out of Food in the Last Year: Never true  Transportation Needs: No Transportation Needs (08/01/2022)   PRAPARE - Transportation    Lack of Transportation (Medical): No    Lack of Transportation (Non-Medical): No  Physical Activity: Unknown (04/03/2017)   Exercise Vital Sign    Days of Exercise per Week:   1 day    Minutes of Exercise per Session: Not on file  Stress: Stress Concern Present (04/03/2017)   [employer] - Occupational Stress Questionnaire    Feeling of Stress : Very much  Social Connections: Not on file     Family History: The patient's family history includes Cervical cancer (age of onset: 40) in her maternal aunt; Colon cancer in an other family member; Colon cancer (age of onset: 40) in her maternal aunt; Colon polyps in her mother; Diabetes in her maternal grandmother and mother; Hypertension in her maternal grandmother and mother; Prostate cancer (age of onset: 65) in her maternal grandfather. There is no history of Esophageal cancer, Rectal cancer, or Stomach cancer.  ROS:   Review of Systems  Constitution:  Negative for decreased appetite, fever and weight gain.  HENT: Negative for congestion, ear discharge, hoarse voice and sore throat.   Eyes: Negative for discharge, redness, vision loss in right eye and visual halos.  Cardiovascular: Negative for chest pain, dyspnea on exertion, leg swelling, orthopnea and palpitations.  Respiratory: Negative for cough, hemoptysis, shortness of breath and snoring.   Endocrine: Negative for heat intolerance and polyphagia.  Hematologic/Lymphatic: Negative for bleeding problem. Does not bruise/bleed easily.  Skin: Negative for flushing, nail changes, rash and suspicious lesions.  Musculoskeletal: Negative for arthritis, joint pain, muscle cramps, myalgias, neck pain and stiffness.  Gastrointestinal: Negative for abdominal pain, bowel incontinence, diarrhea and excessive appetite.  Genitourinary: Negative for decreased libido, genital sores and incomplete emptying.  Neurological: Negative for brief paralysis, focal weakness, headaches and loss of balance.  Psychiatric/Behavioral: Negative for altered mental status, depression and suicidal ideas.  Allergic/Immunologic: Negative for HIV exposure and persistent infections.    EKGs/Labs/Other Studies Reviewed:    The following studies were reviewed today:   EKG:  The ekg ordered today demonstrates sinus tachycardia. HR 106 bpm.  Exercise tolerance test July 04, 2022   The patient exercised according to the BRUCE protocol for 6:00min achieving 7.0 METs.   Patient with resting hypertension 180/112   There is horizontal ST depression (II, III, aVF, V4, V5 and V6) was noted.   Electrocardiogram concerning for possible ischemia although patient was also very hypertensive which may be contributing to ST changes. Recommend further ischemic work-up with coronary CTA vs cath pending clinical symptoms.  Recent Labs: 07/31/2022: ALT 17; BUN 13; Creatinine, Ser 0.70; Magnesium 2.2; Potassium 4.5; Sodium 143 08/10/2022:  Hemoglobin 12.7; Platelets 260  Recent Lipid Panel    Component Value Date/Time   CHOL 240 (H) 08/22/2021 1002   TRIG 61 08/22/2021 1002   HDL 65 08/22/2021 1002   CHOLHDL 3.7 08/22/2021 1002   LDLCALC 165 (H) 08/22/2021 1002    Physical Exam:    VS:  BP 128/78   Pulse 92   Ht 5' 4" (1.626 m)   Wt 176 lb 9.6 oz (80.1 kg)   LMP 08/26/2017   SpO2 98%   BMI 30.31 kg/m     Wt Readings from Last 3 Encounters:  08/10/22 176 lb 9.6 oz (80.1 kg)  07/31/22 180 lb 9.6 oz (81.9 kg)  06/27/22 179 lb (81.2 kg)     GEN: Well nourished, well developed in no acute distress HEENT: Normal NECK: No JVD; No carotid bruits LYMPHATICS: No lymphadenopathy CARDIAC: S1S2 noted,RRR, no murmurs, rubs, gallops RESPIRATORY:  Clear to auscultation without rales, wheezing or rhonchi  ABDOMEN: Soft, non-tender, non-distended, +bowel sounds, no guarding. EXTREMITIES: No edema, No cyanosis, no clubbing MUSCULOSKELETAL:    No deformity  SKIN: Warm and dry NEUROLOGIC:  Alert and oriented x 3, non-focal PSYCHIATRIC:  Normal affect, good insight  ASSESSMENT:    1. Pre-procedure lab exam   2. Coronary artery disease involving native coronary artery of native heart, unspecified whether angina present   3. Benign essential hypertension   4. Mixed hyperlipidemia   5. Obesity (BMI 30-39.9)     PLAN:    Her coronary CT scan does show evidence of a left main mild disease which on CT FFR is suggesting abnormal flow.  With her history of ischemia on her recent stress echocardiogram the best thing for this patient with her risk factors as well as to get a left heart catheterization.  The patient understands that risks include but are not limited to stroke (1 in 1000), death (1 in 1000), kidney failure [usually temporary] (1 in 500), bleeding (1 in 200), allergic reaction [possibly serious] (1 in 200), and agrees to proceed.  Blood pressure is within normal   The patient is in agreement with the above plan.  The patient left the office in stable condition.  The patient will follow up in   Medication Adjustments/Labs and Tests Ordered: Current medicines are reviewed at length with the patient today.  Concerns regarding medicines are outlined above.  Orders Placed This Encounter  Procedures   CBC with Differential/Platelet   No orders of the defined types were placed in this encounter.   Patient Instructions  Medication Instructions:  Your physician recommends that you continue on your current medications as directed. Please refer to the Current Medication list given to you today.  *If you need a refill on your cardiac medications before your next appointment, please call your pharmacy*   Lab Work: Your physician recommends that you have labs drawn today: CBC If you have labs (blood work) drawn today and your tests are completely normal, you will receive your results only by: MyChart Message (if you have MyChart) OR A paper copy in the mail If you have any lab test that is abnormal or we need to change your treatment, we will call you to review the results.   Testing/Procedures  You are scheduled for a Cardiac Catheterization on Tuesday, April 16 with Dr. Thomas Kelly.  1. Please arrive at the [facility] (Main Entrance A) at [facility]: [address] [place], [place] [address] at 7:00 AM (This time is two hours before your procedure to ensure your preparation). Free valet parking service is available. You will check in at ADMITTING. The support person will be asked to wait in the waiting room.  It is OK to have someone drop you off and come back when you are ready to be discharged.    Special note: Every effort is made to have your procedure done on time. Please understand that emergencies sometimes delay scheduled procedures.  2. Diet: Do not eat solid foods after midnight.  The patient may have clear liquids until 5am upon the day of the procedure.  3. Labs: You will need  to have blood drawn on TODAY 4. Medication instructions in preparation for your procedure:   Contrast Allergy: No  On the morning of your procedure, take your Aspirin 81 mg and any morning medicines NOT listed above.  You may use sips of water.  5. Plan to go home the same day, you will only stay overnight if medically necessary. 6. Bring a current list of your medications and current insurance cards. 7. You   MUST have a responsible person to drive you home. 8. Someone MUST be with you the first 24 hours after you arrive home or your discharge will be delayed. 9. Please wear clothes that are easy to get on and off and wear slip-on shoes.  Thank you for allowing us to care for you!   -- [facility]    Follow-Up: At [facility], you and your health needs are our priority.  As part of our continuing mission to provide you with exceptional heart care, we have created designated Provider Care Teams.  These Care Teams include your primary Cardiologist (physician) and Advanced Practice Providers (APPs -  Physician Assistants and Nurse Practitioners) who all work together to provide you with the care you need, when you need it.  We recommend signing up for the patient portal called "MyChart".  Sign up information is provided on this After Visit Summary.  MyChart is used to connect with patients for Virtual Visits (Telemedicine).  Patients are able to view lab/test results, encounter notes, upcoming appointments, etc.  Non-urgent messages can be sent to your provider as well.   To learn more about what you can do with MyChart, go to [email or web address].    Your next appointment:   3-4 week(s) post cath (double book okay)   Provider:   Tienna Bienkowski, DO      Adopting a Healthy Lifestyle.  Know what a healthy weight is for you (roughly BMI <25) and aim to maintain this   Aim for 7+ servings of fruits and vegetables daily   65-80+ fluid ounces of  water or unsweet tea for healthy kidneys   Limit to max 1 drink of alcohol per day; avoid smoking/tobacco   Limit animal fats in diet for cholesterol and heart health - choose grass fed whenever available   Avoid highly processed foods, and foods high in saturated/trans fats   Aim for low stress - take time to unwind and care for your mental health   Aim for 150 min of moderate intensity exercise weekly for heart health, and weights twice weekly for bone health   Aim for 7-9 hours of sleep daily   When it comes to diets, agreement about the perfect plan isnt easy to find, even among the experts. Experts at the [employer] developed an idea known as the Healthy Eating Plate. Just imagine a plate divided into logical, healthy portions.   The emphasis is on diet quality:   Load up on vegetables and fruits - one-half of your plate: Aim for color and variety, and remember that potatoes dont count.   Go for whole grains - one-quarter of your plate: Whole wheat, barley, wheat berries, quinoa, oats, brown rice, and foods made with them. If you want pasta, go with whole wheat pasta.   Protein power - one-quarter of your plate: Fish, chicken, beans, and nuts are all healthy, versatile protein sources. Limit red meat.   The diet, however, does go beyond the plate, offering a few other suggestions.   Use healthy plant oils, such as olive, canola, soy, corn, sunflower and peanut. Check the labels, and avoid partially hydrogenated oil, which have unhealthy trans fats.   If youre thirsty, drink water. Coffee and tea are good in moderation, but skip sugary drinks and limit milk and dairy products to one or two daily servings.   The type of carbohydrate in the diet is more important than the amount. Some sources of   carbohydrates, such as vegetables, fruits, whole grains, and beans-are healthier than others.   Finally, stay active  Signed, Zailee Vallely, DO  08/11/2022 4:58 PM     [facility] 

## 2022-08-11 LAB — CBC WITH DIFFERENTIAL/PLATELET
Basophils Absolute: 0 10*3/uL (ref 0.0–0.2)
Basos: 1 %
EOS (ABSOLUTE): 0.1 10*3/uL (ref 0.0–0.4)
Eos: 2 %
Hematocrit: 38.8 % (ref 34.0–46.6)
Hemoglobin: 12.7 g/dL (ref 11.1–15.9)
Immature Grans (Abs): 0 10*3/uL (ref 0.0–0.1)
Immature Granulocytes: 0 %
Lymphocytes Absolute: 1.5 10*3/uL (ref 0.7–3.1)
Lymphs: 36 %
MCH: 26.6 pg (ref 26.6–33.0)
MCHC: 32.7 g/dL (ref 31.5–35.7)
MCV: 81 fL (ref 79–97)
Monocytes Absolute: 0.4 10*3/uL (ref 0.1–0.9)
Monocytes: 8 %
Neutrophils Absolute: 2.2 10*3/uL (ref 1.4–7.0)
Neutrophils: 53 %
Platelets: 260 10*3/uL (ref 150–450)
RBC: 4.78 x10E6/uL (ref 3.77–5.28)
RDW: 12.6 % (ref 11.7–15.4)
WBC: 4.2 10*3/uL (ref 3.4–10.8)

## 2022-08-13 ENCOUNTER — Telehealth: Payer: Self-pay | Admitting: *Deleted

## 2022-08-13 NOTE — Telephone Encounter (Signed)
Cardiac Catheterization scheduled at [facility] for: Tuesday August 14, 2022 9 AM Arrival time [facility] Main Entrance A at:7 AM  Nothing to eat after midnight prior to procedure, clear liquids until 5 AM day of procedure.  Medication instructions: -Usual morning medications can be taken with sips of water including aspirin 81 mg.  Confirmed patient has responsible adult to drive home post procedure and be with patient first 24 hours after arriving home.  Plan to go home the same day, you will only stay overnight if medically necessary.  Reviewed procedure instructions with patient.

## 2022-08-14 ENCOUNTER — Ambulatory Visit (HOSPITAL_COMMUNITY)
Admission: RE | Admit: 2022-08-14 | Discharge: 2022-08-14 | Disposition: A | Discharge status: Home / Self Care | Payer: Commercial Managed Care - PPO | Source: Ambulatory Visit | Attending: Cardiovascular Disease | Admitting: Cardiovascular Disease

## 2022-08-14 ENCOUNTER — Encounter (HOSPITAL_COMMUNITY)
Admission: RE | Disposition: A | Discharge status: Home / Self Care | Payer: Self-pay | Source: Ambulatory Visit | Attending: Cardiovascular Disease

## 2022-08-14 ENCOUNTER — Other Ambulatory Visit: Payer: Self-pay

## 2022-08-14 DIAGNOSIS — I10 Essential (primary) hypertension: Secondary | ICD-10-CM | POA: Insufficient documentation

## 2022-08-14 DIAGNOSIS — E785 Hyperlipidemia, unspecified: Secondary | ICD-10-CM | POA: Insufficient documentation

## 2022-08-14 DIAGNOSIS — I2510 Atherosclerotic heart disease of native coronary artery without angina pectoris: Secondary | ICD-10-CM | POA: Insufficient documentation

## 2022-08-14 DIAGNOSIS — Z79899 Other long term (current) drug therapy: Secondary | ICD-10-CM | POA: Insufficient documentation

## 2022-08-14 DIAGNOSIS — E669 Obesity, unspecified: Secondary | ICD-10-CM | POA: Insufficient documentation

## 2022-08-14 DIAGNOSIS — E782 Mixed hyperlipidemia: Secondary | ICD-10-CM | POA: Insufficient documentation

## 2022-08-14 DIAGNOSIS — Z6830 Body mass index (BMI) 30.0-30.9, adult: Secondary | ICD-10-CM | POA: Insufficient documentation

## 2022-08-14 HISTORY — PX: LEFT HEART CATH AND CORONARY ANGIOGRAPHY: CATH118249

## 2022-08-14 SURGERY — LEFT HEART CATH AND CORONARY ANGIOGRAPHY
Anesthesia: LOCAL

## 2022-08-14 MED ORDER — MIDAZOLAM HCL 2 MG/2ML IJ SOLN
INTRAMUSCULAR | Status: AC
  Filled 2022-08-14: qty 2

## 2022-08-14 MED ORDER — SODIUM CHLORIDE 0.9 % IV SOLN: INTRAVENOUS | Status: DC

## 2022-08-14 MED ORDER — IOHEXOL 350 MG/ML SOLN
INTRAVENOUS | Status: DC | PRN
  Administered 2022-08-14: 65 mL

## 2022-08-14 MED ORDER — SODIUM CHLORIDE 0.9 % WEIGHT BASED INFUSION
3.0000 mL/kg/h | INTRAVENOUS | Status: AC
  Administered 2022-08-14: 3 mL/kg/h via INTRAVENOUS

## 2022-08-14 MED ORDER — LIDOCAINE HCL (PF) 1 % IJ SOLN
INTRAMUSCULAR | Status: DC | PRN
  Administered 2022-08-14: 2 mL

## 2022-08-14 MED ORDER — FENTANYL CITRATE (PF) 100 MCG/2ML IJ SOLN
INTRAMUSCULAR | Status: AC
  Filled 2022-08-14: qty 2

## 2022-08-14 MED ORDER — LABETALOL HCL 5 MG/ML IV SOLN: 10.0000 mg | INTRAVENOUS | Status: DC | PRN

## 2022-08-14 MED ORDER — ASPIRIN 81 MG PO CHEW: 81.0000 mg | CHEWABLE_TABLET | Freq: Every day | ORAL | Status: DC

## 2022-08-14 MED ORDER — SODIUM CHLORIDE 0.9 % IV SOLN: 250.0000 mL | INTRAVENOUS | Status: DC | PRN

## 2022-08-14 MED ORDER — SODIUM CHLORIDE 0.9% FLUSH: 3.0000 mL | INTRAVENOUS | Status: DC | PRN

## 2022-08-14 MED ORDER — ONDANSETRON HCL 4 MG/2ML IJ SOLN: 4.0000 mg | Freq: Four times a day (QID) | INTRAMUSCULAR | Status: DC | PRN

## 2022-08-14 MED ORDER — VERAPAMIL HCL 2.5 MG/ML IV SOLN
INTRAVENOUS | Status: DC | PRN
  Administered 2022-08-14: 10 mL via INTRA_ARTERIAL

## 2022-08-14 MED ORDER — HEPARIN (PORCINE) IN NACL 1000-0.9 UT/500ML-% IV SOLN
INTRAVENOUS | Status: DC | PRN
  Administered 2022-08-14 (×2): 500 mL

## 2022-08-14 MED ORDER — VERAPAMIL HCL 2.5 MG/ML IV SOLN
INTRAVENOUS | Status: AC
  Filled 2022-08-14: qty 2

## 2022-08-14 MED ORDER — SODIUM CHLORIDE 0.9% FLUSH: 3.0000 mL | Freq: Two times a day (BID) | INTRAVENOUS | Status: DC

## 2022-08-14 MED ORDER — LIDOCAINE HCL (PF) 1 % IJ SOLN
INTRAMUSCULAR | Status: AC
  Filled 2022-08-14: qty 30

## 2022-08-14 MED ORDER — ASPIRIN 81 MG PO CHEW: 81.0000 mg | CHEWABLE_TABLET | ORAL | Status: DC

## 2022-08-14 MED ORDER — HEPARIN SODIUM (PORCINE) 1000 UNIT/ML IJ SOLN
INTRAMUSCULAR | Status: AC
  Filled 2022-08-14: qty 10

## 2022-08-14 MED ORDER — FENTANYL CITRATE (PF) 100 MCG/2ML IJ SOLN
INTRAMUSCULAR | Status: DC | PRN
  Administered 2022-08-14: 25 ug via INTRAVENOUS

## 2022-08-14 MED ORDER — ACETAMINOPHEN 325 MG PO TABS: 650.0000 mg | ORAL_TABLET | ORAL | Status: DC | PRN

## 2022-08-14 MED ORDER — HEPARIN SODIUM (PORCINE) 1000 UNIT/ML IJ SOLN
INTRAMUSCULAR | Status: DC | PRN
  Administered 2022-08-14: 4000 [IU] via INTRAVENOUS

## 2022-08-14 MED ORDER — MIDAZOLAM HCL 2 MG/2ML IJ SOLN
INTRAMUSCULAR | Status: DC | PRN
  Administered 2022-08-14: 2 mg via INTRAVENOUS

## 2022-08-14 MED ORDER — SODIUM CHLORIDE 0.9 % WEIGHT BASED INFUSION: 1.0000 mL/kg/h | INTRAVENOUS | Status: DC

## 2022-08-14 MED ORDER — HYDRALAZINE HCL 20 MG/ML IJ SOLN: 10.0000 mg | INTRAMUSCULAR | Status: DC | PRN

## 2022-08-14 SURGICAL SUPPLY — 14 items
BAND CMPR LRG ZPHR (HEMOSTASIS) ×1
BAND ZEPHYR COMPRESS 30 LONG (HEMOSTASIS) IMPLANT
CATH INFINITI 5 FR JL3.5 (CATHETERS) IMPLANT
CATH INFINITI JR4 5F (CATHETERS) IMPLANT
CATH OPTITORQUE TIG 4.0 5F (CATHETERS) IMPLANT
GLIDESHEATH SLEND SS 6F .021 (SHEATH) IMPLANT
GUIDEWIRE INQWIRE 1.5J.035X260 (WIRE) IMPLANT
INQWIRE 1.5J .035X260CM (WIRE) ×1
KIT HEART LEFT (KITS) ×2 IMPLANT
PACK CARDIAC CATHETERIZATION (CUSTOM PROCEDURE TRAY) ×2 IMPLANT
SHEATH PROBE COVER 6X72 (BAG) IMPLANT
SYR MEDRAD MARK 7 150ML (SYRINGE) ×2 IMPLANT
TRANSDUCER W/STOPCOCK (MISCELLANEOUS) ×2 IMPLANT
TUBING CIL FLEX 10 FLL-RA (TUBING) ×2 IMPLANT

## 2022-08-14 NOTE — Progress Notes (Signed)
TR BAND REMOVAL  LOCATION:    right radial  DEFLATED PER PROTOCOL:    Yes.    TIME BAND OFF / DRESSING APPLIED:    1605 Gauze dressing applied    SITE UPON ARRIVAL:    Level 0  SITE AFTER BAND REMOVAL:    Level 0  CIRCULATION SENSATION AND MOVEMENT:    Within Normal Limits   Yes.    COMMENTS:   no issues noted

## 2022-08-14 NOTE — Discharge Instructions (Signed)

## 2022-08-14 NOTE — Interval H&P Note (Signed)
Cath Lab Visit (complete for each Cath Lab visit)  Clinical Evaluation Leading to the Procedure:   ACS: No.  Non-ACS:    Anginal Classification: CCS II  Anti-ischemic medical therapy: Minimal Therapy (1 class of medications)  Non-Invasive Test Results: Intermediate-risk stress test findings: cardiac mortality 1-3%/year  Prior CABG: No previous CABG      History and Physical Interval Note:  08/14/2022 12:45 PM  Shelby Taylor  has presented today for surgery, with the diagnosis of CAD.  The various methods of treatment have been discussed with the patient and family. After consideration of risks, benefits and other options for treatment, the patient has consented to  Procedure(s): LEFT HEART CATH AND CORONARY ANGIOGRAPHY (N/A) as a surgical intervention.  The patient's history has been reviewed, patient examined, no change in status, stable for surgery.  I have reviewed the patient's chart and labs.  Questions were answered to the patient's satisfaction.     Nicki Guadalajara

## 2022-08-15 ENCOUNTER — Encounter (HOSPITAL_COMMUNITY): Payer: Self-pay | Admitting: Cardiovascular Disease

## 2022-08-27 ENCOUNTER — Encounter: Payer: Self-pay | Admitting: Nurse Practitioner

## 2022-08-27 ENCOUNTER — Ambulatory Visit (INDEPENDENT_AMBULATORY_CARE_PROVIDER_SITE_OTHER): Payer: Self-pay | Admitting: Nurse Practitioner

## 2022-08-27 VITALS — BP 120/78 | HR 98 | Temp 98.3°F | Ht 64.0 in | Wt 177.0 lb

## 2022-08-27 DIAGNOSIS — E559 Vitamin D deficiency, unspecified: Secondary | ICD-10-CM

## 2022-08-27 DIAGNOSIS — Z0000 Encounter for general adult medical examination without abnormal findings: Secondary | ICD-10-CM

## 2022-08-27 DIAGNOSIS — Z6830 Body mass index (BMI) 30.0-30.9, adult: Secondary | ICD-10-CM

## 2022-08-27 DIAGNOSIS — Z79899 Other long term (current) drug therapy: Secondary | ICD-10-CM

## 2022-08-27 DIAGNOSIS — I10 Essential (primary) hypertension: Secondary | ICD-10-CM

## 2022-08-27 DIAGNOSIS — Z2821 Immunization not carried out because of patient refusal: Secondary | ICD-10-CM

## 2022-08-27 LAB — POCT URINALYSIS DIPSTICK
Bilirubin, UA: NEGATIVE
Glucose, UA: POSITIVE — AB
Ketones, UA: NEGATIVE
Leukocytes, UA: NEGATIVE
Nitrite, UA: NEGATIVE
Protein, UA: NEGATIVE
Spec Grav, UA: 1.025 (ref 1.010–1.025)
Urobilinogen, UA: 0.2 E.U./dL
pH, UA: 7 (ref 5.0–8.0)

## 2022-08-27 NOTE — Progress Notes (Signed)
**Note Shelby-Identified via Obfuscation** Hershal Coria Taylor,acting as a Neurosurgeon for Shelby Felts, FNP.,have documented all relevant documentation on the behalf of Shelby Felts, FNP,as directed by  Shelby Felts, FNP while in the presence of Shelby Felts, FNP.   Subjective:     Patient ID: Shelby Taylor , female    DOB: [birth year] , 57 y.o.   MRN: [id number]   Chief Complaint  Patient presents with   Annual Exam    HPI  Pt presents for HM. She reports taking medications as directed. Patient has no concerns today. She had a heart cath which was essentially normal. She is scheduled to see the Cardiologist on 09/04/22.   She had a hysterectomy and removed both ovaries due to fibroids were wrapped.   BP Readings from Last 3 Encounters: 08/27/22 : 120/78 08/14/22 : 128/84 08/10/22 : 128/78       Past Medical History:  Diagnosis Date   Hx of adenomatous colonic polyps 12/20/2019   Hypertension      Family History  Problem Relation Age of Onset   Diabetes Mother    Hypertension Mother    Colon polyps Mother    Diabetes Maternal Grandmother    Hypertension Maternal Grandmother    Prostate cancer Maternal Grandfather 23   Cervical cancer Maternal Aunt 56       Deceased   Colon cancer Maternal Aunt 40   Colon cancer Other    Esophageal cancer Neg Hx    Rectal cancer Neg Hx    Stomach cancer Neg Hx      Current Outpatient Medications:    amLODipine (NORVASC) 5 MG tablet, Take 1 tablet (5 mg total) by mouth daily., Disp: 90 tablet, Rfl: 3   Cholecalciferol (VITAMIN D3) 50 MCG (2000 UT) TABS, Take 50 mcg by mouth as directed. (Patient taking differently: Take 2,000 Units by mouth daily.), Disp: 30 tablet, Rfl: 3   fluticasone (FLONASE) 50 MCG/ACT nasal spray, Place 1 spray into both nostrils daily as needed for allergies., Disp: , Rfl:    lisinopril (ZESTRIL) 20 MG tablet, Take 1 tablet (20 mg total) by mouth daily., Disp: 90 tablet, Rfl: 1   Multiple Vitamin (MULTIVITAMIN) tablet, Take 1 tablet by mouth daily., Disp:  , Rfl:    triamcinolone ointment (KENALOG) 0.5 %, Apply 1 Application topically 2 (two) times daily. (Patient taking differently: Apply 1 Application topically daily as needed (Rash).), Disp: 30 g, Rfl: 0   Allergies  Allergen Reactions   Morphine And Related Itching      The patient states she had a hysterectomy.  Patient's last menstrual period was 08/26/2017.. Negative for Dysmenorrhea and Negative for Menorrhagia. Negative for: breast discharge, breast lump(s), breast pain and breast self exam. Associated symptoms include abnormal vaginal bleeding. Pertinent negatives include abnormal bleeding (hematology), anxiety, decreased libido, depression, difficulty falling sleep, dyspareunia, history of infertility, nocturia, sexual dysfunction, sleep disturbances, urinary incontinence, urinary urgency, vaginal discharge and vaginal itching. Diet regular; she does not eat red meat mostly fish and occasional [place]. The patient states her exercise level is minimal due to having some cardiac testing, she is not exercising now after her heart cath.   The patient's tobacco use is:  Social History   Tobacco Use  Smoking Status Never  Smokeless Tobacco Never   She has been exposed to passive smoke. The patient's alcohol use is:  Social History   Substance and Sexual Activity  Alcohol Use Yes   Alcohol/week: 1.0 standard drink of alcohol   Types: 1 Glasses of wine  per week   Comment: Social    Review of Systems  Constitutional: Negative.   HENT: Negative.    Eyes: Negative.   Respiratory: Negative.    Cardiovascular: Negative.   Gastrointestinal: Negative.   Endocrine: Negative.   Genitourinary: Negative.   Musculoskeletal: Negative.   Skin: Negative.   Allergic/Immunologic: Negative.   Neurological: Negative.   Hematological: Negative.   Psychiatric/Behavioral: Negative.       Today's Vitals   08/27/22 1002  BP: 120/78  Pulse: 98  Temp: 98.3 F (36.8 C)  TempSrc: Oral   Weight: 177 lb (80.3 kg)  Height: 5\' 4"  (1.626 m)  PainSc: 0-No pain   Body mass index is 30.38 kg/m.  Wt Readings from Last 3 Encounters:  09/04/22 179 lb 9.6 oz (81.5 kg)  08/27/22 177 lb (80.3 kg)  08/14/22 174 lb (78.9 kg)    Objective:  Physical Exam Vitals reviewed.  Constitutional:      General: She is not in acute distress.    Appearance: Normal appearance. She is well-developed. She is obese.  HENT:     Head: Normocephalic and atraumatic.     Right Ear: Hearing, tympanic membrane, ear canal and external ear normal. There is no impacted cerumen.     Left Ear: Hearing, tympanic membrane, ear canal and external ear normal. There is no impacted cerumen.     Nose: Nose normal.     Mouth/Throat:     Mouth: Mucous membranes are moist.  Eyes:     General: Lids are normal.     Extraocular Movements: Extraocular movements intact.     Conjunctiva/sclera: Conjunctivae normal.     Pupils: Pupils are equal, round, and reactive to light.     Funduscopic exam:    Right eye: No papilledema.        Left eye: No papilledema.  Neck:     Thyroid: No thyroid mass.     Vascular: No carotid bruit.  Cardiovascular:     Rate and Rhythm: Normal rate and regular rhythm.     Pulses: Normal pulses.     Heart sounds: Normal heart sounds. No murmur heard. Pulmonary:     Effort: Pulmonary effort is normal. No respiratory distress.     Breath sounds: Normal breath sounds. No wheezing.  Abdominal:     General: Abdomen is flat. Bowel sounds are normal. There is no distension.     Palpations: Abdomen is soft.     Tenderness: There is no abdominal tenderness.  Musculoskeletal:        General: No swelling. Normal range of motion.     Cervical back: Full passive range of motion without pain, normal range of motion and neck supple.     Right lower leg: No edema.     Left lower leg: No edema.  Skin:    General: Skin is warm and dry.     Capillary Refill: Capillary refill takes less than 2  seconds.  Neurological:     General: No focal deficit present.     Mental Status: She is alert and oriented to person, place, and time.     Cranial Nerves: No cranial nerve deficit.     Sensory: No sensory deficit.     Motor: No weakness.  Psychiatric:        Mood and Affect: Mood normal.        Behavior: Behavior normal.        Thought Content: Thought content normal.  Judgment: Judgment normal.         Assessment And Plan:     1. Annual physical exam Behavior modifications discussed and diet history reviewed.   Pt will continue to exercise regularly and modify diet with low GI, plant based foods and decrease intake of processed foods.  Recommend intake of daily multivitamin, Vitamin D, and calcium.  Recommend mammogram and colonoscopy for preventive screenings, as well as recommend immunizations that include influenza, TDAP, and Shingles (declined)  2. Primary hypertension Comments: Blood pressure is well controlled, continue current medications and f/u with Cardiology.  EKG, normal sinus rhythm, unchanged from previous tracings, right atrial enlargement HR 78.  - EKG 12-Lead - POCT urinalysis dipstick - Microalbumin / creatinine urine ratio - Lipid panel  3. Vitamin D deficiency Will check vitamin D level and supplement as needed.    Also encouraged to spend 15 minutes in the sun daily.  - VITAMIN D 25 Hydroxy (Vit-D Deficiency, Fractures)  4. BMI 30.0-30.9,adult She is encouraged to strive for BMI less than 30 to decrease cardiac risk. Advised to aim for at least 150 minutes of exercise per week. - Hemoglobin A1c  5. Herpes zoster vaccination declined Declines shingrix, educated on disease process and is aware if he changes his mind to notify office  6. Other long term (current) drug therapy   Patient was given opportunity to ask questions. Patient verbalized understanding of the plan and was able to repeat key elements of the plan. All questions were answered  to their satisfaction.   Shelby Felts, FNP   I, Shelby Felts, FNP, have reviewed all documentation for this visit. The documentation on 08/27/22 for the exam, diagnosis, procedures, and orders are all accurate and complete.   THE PATIENT IS ENCOURAGED TO PRACTICE SOCIAL DISTANCING DUE TO THE COVID-19 PANDEMIC.

## 2022-08-27 NOTE — Patient Instructions (Addendum)

## 2022-08-28 LAB — LIPID PANEL
Chol/HDL Ratio: 3.7 ratio (ref 0.0–4.4)
Cholesterol, Total: 235 mg/dL — ABNORMAL HIGH (ref 100–199)
HDL: 64 mg/dL (ref >39)
LDL Chol Calc (NIH): 163 mg/dL — ABNORMAL HIGH (ref 0–99)
Triglycerides: 48 mg/dL (ref 0–149)
VLDL Cholesterol Cal: 8 mg/dL (ref 5–40)

## 2022-08-28 LAB — HEMOGLOBIN A1C
Est. average glucose Bld gHb Est-mCnc: 114 mg/dL
Hgb A1c MFr Bld: 5.6 % (ref 4.8–5.6)

## 2022-08-28 LAB — VITAMIN D 25 HYDROXY (VIT D DEFICIENCY, FRACTURES): Vit D, 25-Hydroxy: 27.6 ng/mL — ABNORMAL LOW (ref 30.0–100.0)

## 2022-08-28 LAB — MICROALBUMIN / CREATININE URINE RATIO
Creatinine, Urine: 105.3 mg/dL
Microalb/Creat Ratio: 7 mg/g creat (ref 0–29)
Microalbumin, Urine: 7.7 ug/mL

## 2022-08-30 ENCOUNTER — Ambulatory Visit (HOSPITAL_COMMUNITY): Payer: Self-pay

## 2022-09-04 ENCOUNTER — Encounter: Payer: Self-pay | Admitting: Cardiology

## 2022-09-04 ENCOUNTER — Ambulatory Visit: Payer: Medicaid Other | Attending: Cardiology | Admitting: Cardiology

## 2022-09-04 VITALS — BP 146/92 | HR 88 | Ht 64.0 in | Wt 179.6 lb

## 2022-09-04 DIAGNOSIS — E782 Mixed hyperlipidemia: Secondary | ICD-10-CM

## 2022-09-04 DIAGNOSIS — I10 Essential (primary) hypertension: Secondary | ICD-10-CM

## 2022-09-04 DIAGNOSIS — I2510 Atherosclerotic heart disease of native coronary artery without angina pectoris: Secondary | ICD-10-CM

## 2022-09-04 NOTE — Progress Notes (Signed)
**Note Shelby-Identified via Obfuscation** Cardiology Office Note:    Date:  09/04/2022   ID:  Shelby Taylor, DOB 07/29/1965, MRN [id number]  PCP:  Arnette Felts, FNP  Cardiologist:  Thomasene Ripple, DO  Electrophysiologist:  None   Referring MD: Arnette Felts, FNP   " I recently had the heart catheterization.  History of Present Illness:    Shelby Taylor is a 57 y.o. female with a hx of mild nonobstructive coronary artery disease hypertension, hyperlipidemia here today for follow-up visit.  She recently underwent a left heart catheterization.  No stents were placed.  She is here today and offers no other complaints at this time. She shared with me that she has stopped the lisinopril and has been taking her amlodipine.  She feels that he has been working much better for her blood pressure.  Past Medical History:  Diagnosis Date   Hx of adenomatous colonic polyps 12/20/2019   Hypertension     Past Surgical History:  Procedure Laterality Date   COLONOSCOPY W/ POLYPECTOMY  11/2019   LEFT HEART CATH AND CORONARY ANGIOGRAPHY N/A 08/14/2022   Procedure: LEFT HEART CATH AND CORONARY ANGIOGRAPHY;  Surgeon: Lennette Bihari, MD;  Location: [facility];  Service: Cardiovascular;  Laterality: N/A;   TOTAL ABDOMINAL HYSTERECTOMY W/ BILATERAL SALPINGOOPHORECTOMY  2007   Hx of Fibroids, ovaries removed 2/2 cysts and adhesions to uterus; no hx of abnormal pap smear    Current Medications: Current Meds  Medication Sig   amLODipine (NORVASC) 5 MG tablet Take 1 tablet (5 mg total) by mouth daily.   Cholecalciferol (VITAMIN D3) 50 MCG (2000 UT) TABS Take 50 mcg by mouth as directed. (Patient taking differently: Take 2,000 Units by mouth daily.)   fluticasone (FLONASE) 50 MCG/ACT nasal spray Place 1 spray into both nostrils daily as needed for allergies.   lisinopril (ZESTRIL) 20 MG tablet Take 1 tablet (20 mg total) by mouth daily.   Multiple Vitamin (MULTIVITAMIN) tablet Take 1 tablet by mouth daily.   triamcinolone ointment  (KENALOG) 0.5 % Apply 1 Application topically 2 (two) times daily. (Patient taking differently: Apply 1 Application topically daily as needed (Rash).)     Allergies:   Morphine and related   Social History   Socioeconomic History   Marital status: Single    Spouse name: Not on file   Number of children: Not on file   Years of education: Not on file   Highest education level: Master's degree (e.g., MA, MS, MEng, MEd, MSW, MBA)  Occupational History   Occupation: Teacher, adult education: [employer]    Comment: 4 EA[employer]  Tobacco Use   Smoking status: Never   Smokeless tobacco: Never  Vaping Use   Vaping Use: Never used  Substance and Sexual Activity   Alcohol use: Yes    Alcohol/week: 1.0 standard drink of alcohol    Types: 1 Glasses of wine per week    Comment: Social   Drug use: No   Sexual activity: Never  Other Topics Concern   Not on file  Social History Narrative   Not on file   Social Determinants of Health   Financial Resource Strain: Low Risk  (08/01/2022)   Overall Financial Resource Strain (CARDIA)    Difficulty of Paying Living Expenses: Not very hard  Food Insecurity: No Food Insecurity (08/01/2022)   Hunger Vital Sign    Worried About Running Out of Food in the Last Year: Never true    Ran Out of Food in the  Last Year: Never true  Transportation Needs: No Transportation Needs (08/01/2022)   PRAPARE - Administrator, Civil Service (Medical): No    Lack of Transportation (Non-Medical): No  Physical Activity: Unknown (04/03/2017)   Exercise Vital Sign    Days of Exercise per Week: 1 day    Minutes of Exercise per Session: Not on file  Stress: Stress Concern Present (04/03/2017)   [employer] of Occupational Health - Occupational Stress Questionnaire    Feeling of Stress : Very much  Social Connections: Not on file     Family History: The patient's family history includes Cervical cancer (age of onset: 40) in her maternal aunt; Colon cancer in an  other family member; Colon cancer (age of onset: 74) in her maternal aunt; Colon polyps in her mother; Diabetes in her maternal grandmother and mother; Hypertension in her maternal grandmother and mother; Prostate cancer (age of onset: 90) in her maternal grandfather. There is no history of Esophageal cancer, Rectal cancer, or Stomach cancer.  ROS:   Review of Systems  Constitution: Negative for decreased appetite, fever and weight gain.  HENT: Negative for congestion, ear discharge, hoarse voice and sore throat.   Eyes: Negative for discharge, redness, vision loss in right eye and visual halos.  Cardiovascular: Negative for chest pain, dyspnea on exertion, leg swelling, orthopnea and palpitations.  Respiratory: Negative for cough, hemoptysis, shortness of breath and snoring.   Endocrine: Negative for heat intolerance and polyphagia.  Hematologic/Lymphatic: Negative for bleeding problem. Does not bruise/bleed easily.  Skin: Negative for flushing, nail changes, rash and suspicious lesions.  Musculoskeletal: Negative for arthritis, joint pain, muscle cramps, myalgias, neck pain and stiffness.  Gastrointestinal: Negative for abdominal pain, bowel incontinence, diarrhea and excessive appetite.  Genitourinary: Negative for decreased libido, genital sores and incomplete emptying.  Neurological: Negative for brief paralysis, focal weakness, headaches and loss of balance.  Psychiatric/Behavioral: Negative for altered mental status, depression and suicidal ideas.  Allergic/Immunologic: Negative for HIV exposure and persistent infections.    EKGs/Labs/Other Studies Reviewed:    The following studies were reviewed today:   EKG:  The ekg ordered today demonstrates sinus tachycardia. HR 106 bpm.  Exercise tolerance test July 04, 2022   The patient exercised according to the [facility] protocol for 6:41min achieving 7.0 METs.   Patient with resting hypertension 180/112   There is horizontal ST depression  (II, III, aVF, V4, V5 and V6) was noted.   Electrocardiogram concerning for possible ischemia although patient was also very hypertensive which may be contributing to ST changes. Recommend further ischemic work-up with coronary CTA vs cath pending clinical symptoms.  Recent Labs: 07/31/2022: ALT 17; BUN 13; Creatinine, Ser 0.70; Magnesium 2.2; Potassium 4.5; Sodium 143 08/10/2022: Hemoglobin 12.7; Platelets 260  Recent Lipid Panel    Component Value Date/Time   CHOL 235 (H) 08/27/2022 1106   TRIG 48 08/27/2022 1106   HDL 64 08/27/2022 1106   CHOLHDL 3.7 08/27/2022 1106   LDLCALC 163 (H) 08/27/2022 1106    Physical Exam:    VS:  BP (!) 146/92   Pulse 88   Ht 5\' 4"  (1.626 m)   Wt 179 lb 9.6 oz (81.5 kg)   LMP 08/26/2017   SpO2 97%   BMI 30.83 kg/m     Wt Readings from Last 3 Encounters:  09/04/22 179 lb 9.6 oz (81.5 kg)  08/27/22 177 lb (80.3 kg)  08/14/22 174 lb (78.9 kg)     GEN: Well nourished, well developed  in no acute distress HEENT: Normal NECK: No JVD; No carotid bruits LYMPHATICS: No lymphadenopathy CARDIAC: S1S2 noted,RRR, no murmurs, rubs, gallops RESPIRATORY:  Clear to auscultation without rales, wheezing or rhonchi  ABDOMEN: Soft, non-tender, non-distended, +bowel sounds, no guarding. EXTREMITIES: No edema, No cyanosis, no clubbing MUSCULOSKELETAL:  No deformity  SKIN: Warm and dry NEUROLOGIC:  Alert and oriented x 3, non-focal PSYCHIATRIC:  Normal affect, good insight  ASSESSMENT:    1. Primary hypertension   2. Mixed hyperlipidemia   3. Coronary artery disease involving native coronary artery of native heart without angina pectoris      PLAN:    She is hypertensive in the office today.  She is only been taking amlodipine 5 mg daily.  She has stopped the lisinopril and feels that she is doing well with the amlodipine.  Blood pressure taken by me in the office was elevated.  We discussed at this point she would prefer not to add an additional  medication.  We will be expecting blood pressure from the patient in 2 weeks.  Should her blood pressure be elevated have asked the patient to consider the option of increasing the amlodipine to 10 mg twice a day.  She will let me know.   Terms of the coronary artery disease she is not experiencing any angina at this time.  Ideally we should have the patient on Lipitor she would prefer to hold off on additional medication.  Discussed with the patient about or African-American her study for LP(a) she is agreeable to participate.  Will share further information with the patient to our research coordinator for consent.  The patient is in agreement with the above plan. The patient left the office in stable condition.  The patient will follow up in   Medication Adjustments/Labs and Tests Ordered: Current medicines are reviewed at length with the patient today.  Concerns regarding medicines are outlined above.  No orders of the defined types were placed in this encounter.  No orders of the defined types were placed in this encounter.   Patient Instructions  Medication Instructions:  Your physician recommends that you continue on your current medications as directed. Please refer to the Current Medication list given to you today.  *If you need a refill on your cardiac medications before your next appointment, please call your pharmacy*   Lab Work: None   Testing/Procedures: None   Follow-Up: At [facility], you and your health needs are our priority.  As part of our continuing mission to provide you with exceptional heart care, we have created designated Provider Care Teams.  These Care Teams include your primary Cardiologist (physician) and Advanced Practice Providers (APPs -  Physician Assistants and Nurse Practitioners) who all work together to provide you with the care you need, when you need it.   Your next appointment:   1 year(s)  Provider:   Thomasene Ripple, DO      Adopting a Healthy Lifestyle.  Know what a healthy weight is for you (roughly BMI <25) and aim to maintain this   Aim for 7+ servings of fruits and vegetables daily   65-80+ fluid ounces of water or unsweet tea for healthy kidneys   Limit to max 1 drink of alcohol per day; avoid smoking/tobacco   Limit animal fats in diet for cholesterol and heart health - choose grass fed whenever available   Avoid highly processed foods, and foods high in saturated/trans fats   Aim for low stress - take  time to unwind and care for your mental health   Aim for 150 min of moderate intensity exercise weekly for heart health, and weights twice weekly for bone health   Aim for 7-9 hours of sleep daily   When it comes to diets, agreement about the perfect plan isnt easy to find, even among the experts. Experts at the [facility] [employer] developed an idea known as the Healthy Eating Plate. Just imagine a plate divided into logical, healthy portions.   The emphasis is on diet quality:   Load up on vegetables and fruits - one-half of your plate: Aim for color and variety, and remember that potatoes dont count.   Go for whole grains - one-quarter of your plate: Whole wheat, barley, wheat berries, quinoa, oats, brown rice, and foods made with them. If you want pasta, go with whole wheat pasta.   Protein power - one-quarter of your plate: Fish, chicken, beans, and nuts are all healthy, versatile protein sources. Limit red meat.   The diet, however, does go beyond the plate, offering a few other suggestions.   Use healthy plant oils, such as olive, canola, soy, corn, sunflower and peanut. Check the labels, and avoid partially hydrogenated oil, which have unhealthy trans fats.   If youre thirsty, drink water. Coffee and tea are good in moderation, but skip sugary drinks and limit milk and dairy products to one or two daily servings.   The type of carbohydrate in the diet is more important  than the amount. Some sources of carbohydrates, such as vegetables, fruits, whole grains, and beans-are healthier than others.   Finally, stay active  Signed, Thomasene Ripple, DO  09/04/2022 8:52 PM    [facility]

## 2022-09-04 NOTE — Patient Instructions (Signed)
Medication Instructions:  Your physician recommends that you continue on your current medications as directed. Please refer to the Current Medication list given to you today.  *If you need a refill on your cardiac medications before your next appointment, please call your pharmacy*   Lab Work: None   Testing/Procedures: None   Follow-Up: At [facility], you and your health needs are our priority.  As part of our continuing mission to provide you with exceptional heart care, we have created designated Provider Care Teams.  These Care Teams include your primary Cardiologist (physician) and Advanced Practice Providers (APPs -  Physician Assistants and Nurse Practitioners) who all work together to provide you with the care you need, when you need it.   Your next appointment:   1 year(s)  Provider:   Kardie Tobb, DO   

## 2022-10-08 ENCOUNTER — Ambulatory Visit (HOSPITAL_COMMUNITY): Payer: Self-pay

## 2022-10-30 ENCOUNTER — Encounter: Payer: Medicaid Other | Admitting: *Deleted

## 2022-10-30 DIAGNOSIS — Z006 Encounter for examination for normal comparison and control in clinical research program: Secondary | ICD-10-CM

## 2022-10-30 NOTE — Research (Signed)
AA HEART  Informed Consent   Subject Name: Shelby Taylor  Subject met inclusion and exclusion criteria.  The informed consent form, study requirements and expectations were reviewed with the subject and questions and concerns were addressed prior to the signing of the consent form.  The subject verbalized understanding of the trial requirements.  The subject agreed to participate in the AA HEART  trial and signed the informed consent at 10:56 AM   on 10-30-2022.  The informed consent was obtained prior to performance of any protocol-specific procedures for the subject.  A copy of the signed informed consent was given to the subject and a copy was placed in the subject's medical record.   [place] Diarra Kos, Research Coordinator   BLOOD PRESSURE: 137/80 HEART RATE: 90 SPO2 98 RESP 18 TEMP 98.1

## 2022-11-05 NOTE — Research (Signed)
AA HEART  Informed Consent   Subject Name: Shelby Taylor  Subject met inclusion and exclusion criteria.  The informed consent form, study requirements and expectations were reviewed with the subject and questions and concerns were addressed prior to the signing of the consent form.  The subject verbalized understanding of the trial requirements.  The subject agreed to participate in the AA HEART  trial and signed the informed consent at 10:56 AM   on 10-30-2022.  The informed consent was obtained prior to performance of any protocol-specific procedures for the subject.  A copy of the signed informed consent was given to the subject and a copy was placed in the subject's medical record.   [place] Raylei Losurdo, Research Coordinator   BLOOD PRESSURE: 137/80 HEART RATE: 90 SPO2 98 RESP 18 TEMP 98.1  Are there any labs that are clinically significant?  Yes []  OR No[]    ACCESSION NO. [id number]                                             Page 1 of 1                                                        INVESTIGATOR: ([id number])                          PROTOCOL   [id number]                     Thomasene Ripple, M.D.                              INVESTIGATOR NO.: [id number]                     c/o [facility]                         SUBJECT NUMBER: [id number]                     [place]                 SUBJECT INITIALS NOT COLLECTED:                     [id number]                          VISIT: D0                     [place], [place] [address]                   SPONSOR REPORT TO:                 COLLECTION TIME:11:42 DATE:30-Oct-2022                     J. Antonio Shawnie Dapper                 DATE RECEIVED IN  LABORATORY: 31-Oct-2022                     c/o Sponsor [employer] access         DATE REPORTED BY LABORATORY: 01-Nov-2022                     [place]                          SEX: F  AGE: 57y                     [id number]                   [place], [place]  [place] [phone]                                                                                        Ref. Ranges               Clinical    Comments                                                                          Significance                                                                            Yes*  No                    HEMATOLOGY&DIFFERENTIAL PANEL                      HGB            12.6         11.6-16.4 g/dL                      HCT            42           34-48 %                      RBC            4.8          4.1-5.6 x106/uL                      MCH            26           26-34 pg  MCHC           30      L    31-38 g/dL                      RDW            14.2         12.0-15.0 %                      RBC Morph      No Review Required                        MCV            88           79-98 fL                      WBC            3.68    L    3.80-10.70 x103/uL                      Neutrophil     2.03         1.96-7.23 x103/uL                      Lymphocyte     1.26         0.91-4.28 x103/uL                      Monocytes      0.26         0.12-0.92 x103/uL                      Eosinophil     0.10         0.00-0.57 x103/uL                      Basophils      0.02         0.00-0.20 x103/uL                      Neutrophil     55.3         40.5-75.0 %                      Lymphocyte     34.4         15.4-48.5%                      Monocytes      7.0          2.6-10.1 %                      Eosinophil     2.6          0.0-6.8 %                      Basophils      0.7          0.0-2.0 %                      Platelets      218  140-400 x103/uL   ANC                      ANC            2.03         1.96-7.23 x103/uL                    HBA1C                      Fast HbA1c     5.3          <6.5%  RETICULOCYTES                      Retic %        1.2          0.6-2.5 %                      Retic Abs      0.057        0.030-0.120  x106/uL     [id number] LPA COLLECTION D/T                      Untimed D      30-Oct-2022                        Untimed T      11:42                            SM/PLASMA PROTEO & BMS D/T                      Untimed D      30-Oct-2022                        Untimed T      11:42                            SM/WB DNA COLLECTION D/T                      Untimed D      30-Oct-2022                        Untimed T      11:42                            SM/WB PAXGENE RNA COLLECT D/T                      Untimed D      30-Oct-2022                        Untimed T      11:42                            SUBJECT FASTED AT LEAST 9 HRS?                      Pt fast 9h     No

## 2022-11-09 ENCOUNTER — Other Ambulatory Visit (HOSPITAL_COMMUNITY): Payer: Self-pay

## 2022-11-09 ENCOUNTER — Encounter (HOSPITAL_COMMUNITY): Payer: Self-pay

## 2022-11-13 ENCOUNTER — Telehealth: Payer: Self-pay

## 2022-11-13 NOTE — Telephone Encounter (Signed)
Sample for ASA 81 mg left for patient on  09/04/22.  Not picked up. Placed back in samples

## 2022-11-14 ENCOUNTER — Encounter: Payer: Self-pay | Admitting: Cardiology

## 2022-11-14 ENCOUNTER — Encounter: Payer: Self-pay | Admitting: Nurse Practitioner

## 2022-12-10 ENCOUNTER — Other Ambulatory Visit (HOSPITAL_COMMUNITY): Payer: Self-pay

## 2022-12-10 ENCOUNTER — Encounter: Payer: Self-pay | Admitting: Nurse Practitioner

## 2022-12-10 ENCOUNTER — Encounter: Payer: Self-pay | Admitting: Internal Medicine

## 2022-12-10 ENCOUNTER — Telehealth (INDEPENDENT_AMBULATORY_CARE_PROVIDER_SITE_OTHER): Payer: Self-pay | Admitting: Internal Medicine

## 2022-12-10 VITALS — BP 158/90 | HR 114

## 2022-12-10 DIAGNOSIS — I10 Essential (primary) hypertension: Secondary | ICD-10-CM

## 2022-12-10 DIAGNOSIS — U071 COVID-19: Secondary | ICD-10-CM

## 2022-12-10 DIAGNOSIS — M79604 Pain in right leg: Secondary | ICD-10-CM

## 2022-12-10 MED ORDER — CYCLOBENZAPRINE HCL 5 MG PO TABS
5.0000 mg | ORAL_TABLET | Freq: Three times a day (TID) | ORAL | 0 refills | Status: AC | PRN
  Filled 2022-12-10: qty 30, 10d supply, fill #0

## 2022-12-10 MED ORDER — NIRMATRELVIR/RITONAVIR (PAXLOVID)TABLET
3.0000 | ORAL_TABLET | Freq: Two times a day (BID) | ORAL | 0 refills | Status: AC
  Filled 2022-12-10 – 2022-12-11 (×4): qty 30, 5d supply, fill #0

## 2022-12-10 NOTE — Assessment & Plan Note (Addendum)
She would like treatment, rx paxlovid sent to her pharmacy. Advised to take full course, possible side effects d/w patient. I will also refer her for home monitoring/temperature monitoring program. She is encouraged to email me daily on Mychart to let me know how she is doing. She is encouraged to go to ER should she develop worsening SOB. Pt advised that she will be out of work for five days, although she may return sooner if afebrile greater than 24 hours, she should mask for 10 days. She is also advised to stay well hydrated, move periodically throughout the day and to have a hot beverage daily.  She verbalizes understanding of her treatment plan. All questions were answered to her satisfaction. She understands that she needs to continue to self quarantine while in her home.

## 2022-12-10 NOTE — Progress Notes (Signed)
**Note Shelby-Identified via Obfuscation** Virtual Visit via Video   This visit type was conducted due to national recommendations for restrictions regarding the COVID-19 Pandemic (e.g. social distancing) in an effort to limit this patient's exposure and mitigate transmission in our community.  Due to her co-morbid illnesses, this patient is at least at moderate risk for complications without adequate follow up.  This format is felt to be most appropriate for this patient at this time.  All issues noted in this document were discussed and addressed.  A limited physical exam was performed with this format.    This visit type was conducted due to national recommendations for restrictions regarding the COVID-19 Pandemic (e.g. social distancing) in an effort to limit this patient's exposure and mitigate transmission in our community.  Patients identity confirmed using two different identifiers.  This format is felt to be most appropriate for this patient at this time.  All issues noted in this document were discussed and addressed.  No physical exam was performed (except for noted visual exam findings with Video Visits).    Date:  12/10/2022   ID:  Shelby Taylor, DOB 1966-01-20, MRN [id number]  Patient Location:  Home  Provider location:   Office    Chief Complaint:  "I have COVID"  History of Present Illness:    Shelby Taylor is a 57 y.o. female who presents via video conferencing for a telehealth visit today.    The patient does have symptoms concerning for COVID-19 infection (fever, chills, cough, or new shortness of breath).   She presents today for virtual visit for COVID treatment.  She states she developed fatigue, lethargy on Saturday.  She took vitamin C and ibuprofen and laid down. She didn't feel any better upon awakening. At that time, she had fatigue and headache. On Sunday morning, she felt like she had a fever. She initially thought her sx were due to her allergies.  All day yesterday, she had fevers/chills on  and off.  She decided to do home COVID test early this am which was positive.   She works as a Engineer, civil (consulting) in [employer]. She admits there is a lot of COVID in this facility.       Past Medical History:  Diagnosis Date   Hx of adenomatous colonic polyps 12/20/2019   Hypertension    Past Surgical History:  Procedure Laterality Date   COLONOSCOPY W/ POLYPECTOMY  11/2019   LEFT HEART CATH AND CORONARY ANGIOGRAPHY N/A 08/14/2022   Procedure: LEFT HEART CATH AND CORONARY ANGIOGRAPHY;  Surgeon: Lennette Bihari, MD;  Location: [facility];  Service: Cardiovascular;  Laterality: N/A;   TOTAL ABDOMINAL HYSTERECTOMY W/ BILATERAL SALPINGOOPHORECTOMY  2007   Hx of Fibroids, ovaries removed 2/2 cysts and adhesions to uterus; no hx of abnormal pap smear     Current Meds  Medication Sig   amLODipine (NORVASC) 5 MG tablet Take 1 tablet (5 mg total) by mouth daily.   Cholecalciferol (VITAMIN D3) 50 MCG (2000 UT) TABS Take 50 mcg by mouth as directed. (Patient taking differently: Take 2,000 Units by mouth daily.)   cyclobenzaprine (FLEXERIL) 5 MG tablet Take 1 tablet (5 mg total) by mouth 3 (three) times daily as needed for muscle spasms.   Multiple Vitamin (MULTIVITAMIN) tablet Take 1 tablet by mouth daily.   nirmatrelvir/ritonavir (PAXLOVID) 20 x 150 MG & 10 x 100MG  TABS Take 3 tablets by mouth 2 (two) times daily for 5 days. Patient GFR is 101.   triamcinolone ointment (KENALOG) 0.5 %  Apply 1 Application topically 2 (two) times daily. (Patient taking differently: Apply 1 Application topically daily as needed (Rash).)     Allergies:   Morphine and codeine   Social History   Tobacco Use   Smoking status: Never   Smokeless tobacco: Never  Vaping Use   Vaping status: Never Used  Substance Use Topics   Alcohol use: Yes    Alcohol/week: 1.0 standard drink of alcohol    Types: 1 Glasses of wine per week    Comment: Social   Drug use: No     Family Hx: The patient's family history  includes Cervical cancer (age of onset: 64) in her maternal aunt; Colon cancer in an other family member; Colon cancer (age of onset: 51) in her maternal aunt; Colon polyps in her mother; Diabetes in her maternal grandmother and mother; Hypertension in her maternal grandmother and mother; Prostate cancer (age of onset: 2) in her maternal grandfather. There is no history of Esophageal cancer, Rectal cancer, or Stomach cancer.  ROS:   Please see the history of present illness.    Review of Systems  Constitutional: Negative.   HENT:  Positive for congestion.   Respiratory:  Positive for cough.   Cardiovascular: Negative.  Negative for claudication.  Gastrointestinal: Negative.   Musculoskeletal:  Positive for myalgias.       She also c/o right leg pain. Pain started on 8/1 - described as constant cramping pain. She thinks it is due to muscle spasms. Pain is exacerbated by bending down. She does admit to recent long trip, she drove to [place] on 11/23/22. Denies urinary/fecal incontinence.  She denies pain with ambulation. No relief with ibuprofen  Neurological: Negative.   Psychiatric/Behavioral: Negative.      All other systems reviewed and are negative.   Labs/Other Tests and Data Reviewed:    Recent Labs: 07/31/2022: ALT 17; BUN 13; Creatinine, Ser 0.70; Magnesium 2.2; Potassium 4.5; Sodium 143 08/10/2022: Hemoglobin 12.7; Platelets 260   Recent Lipid Panel Lab Results  Component Value Date/Time   CHOL 235 (H) 08/27/2022 11:06 AM   TRIG 48 08/27/2022 11:06 AM   HDL 64 08/27/2022 11:06 AM   CHOLHDL 3.7 08/27/2022 11:06 AM   LDLCALC 163 (H) 08/27/2022 11:06 AM    Wt Readings from Last 3 Encounters:  09/04/22 179 lb 9.6 oz (81.5 kg)  08/27/22 177 lb (80.3 kg)  08/14/22 174 lb (78.9 kg)    BP Readings from Last 3 Encounters:  12/10/22 (!) 158/90  09/04/22 (!) 146/92  08/27/22 120/78    Exam:    Vital Signs:  BP (!) 158/90   Pulse (!) 114   LMP 08/26/2017     Physical  Exam Vitals and nursing note reviewed.  Constitutional:      Appearance: Normal appearance.  HENT:     Head: Normocephalic and atraumatic.     Comments: Sounds congested Eyes:     Extraocular Movements: Extraocular movements intact.  Pulmonary:     Effort: Pulmonary effort is normal.     Comments: Coughs sporadically during visit. Able to speak in full sentences without labored breathing Musculoskeletal:     Cervical back: Normal range of motion.  Neurological:     Mental Status: She is alert and oriented to person, place, and time.  Psychiatric:        Mood and Affect: Affect normal.     ASSESSMENT & PLAN:    COVID-19 Assessment & Plan:  She would like treatment, rx paxlovid sent  to her pharmacy. Advised to take full course, possible side effects d/w patient. I will also refer her for home monitoring/temperature monitoring program. She is encouraged to email me daily on Mychart to let me know how she is doing. She is encouraged to go to ER should she develop worsening SOB. Pt advised that she will be out of work for five days, although she may return sooner if afebrile greater than 24 hours, she should mask for 10 days. She is also advised to stay well hydrated, move periodically throughout the day and to have a hot beverage daily.  She verbalizes understanding of her treatment plan. All questions were answered to her satisfaction. She understands that she needs to continue to self quarantine while in her home.   Orders: -     MyChart Temperature FLOWSHEET; Future  Primary hypertension Assessment & Plan: Chronic, previously uncontrolled. Encouraged to take meds as prescribed and to follow a low sodium diet. She plans to f/u with her PCP, Arnette Felts at her next visit.    Right leg pain Assessment & Plan: Sounds muscular in nature, sx suggestive of sciatica. Will send rx cyclobenzaprine to use prn. She should also stretch intermittently throughout the day.  She should implement  stretches to address sciatica. She agrees to come to office for further evaluation if her sx persist.    Other orders -     [facility] COVID-19 HOME MONITORING PROGRAM; Future -     nirmatrelvir/ritonavir; Take 3 tablets by mouth 2 (two) times daily for 5 days. Patient GFR is 101.  Dispense: 30 tablet; Refill: 0 -     Cyclobenzaprine HCl; Take 1 tablet (5 mg total) by mouth 3 (three) times daily as needed for muscle spasms.  Dispense: 30 tablet; Refill: 0       COVID-19 Education: The signs and symptoms of COVID-19 were discussed with the patient and how to seek care for testing (follow up with PCP or arrange E-visit).  The importance of social distancing was discussed today.  Patient Risk:   After full review of this patients clinical status, I feel that they are at least moderate risk at this time.  Time:   Today, I have spent 14 minutes/ seconds with the patient with telehealth technology discussing above diagnoses.     Medication Adjustments/Labs and Tests Ordered: Current medicines are reviewed at length with the patient today.  Concerns regarding medicines are outlined above.   Tests Ordered: No orders of the defined types were placed in this encounter.   Medication Changes: Meds ordered this encounter  Medications   nirmatrelvir/ritonavir (PAXLOVID) 20 x 150 MG & 10 x 100MG  TABS    Sig: Take 3 tablets by mouth 2 (two) times daily for 5 days. Patient GFR is 101.    Dispense:  30 tablet    Refill:  0   cyclobenzaprine (FLEXERIL) 5 MG tablet    Sig: Take 1 tablet (5 mg total) by mouth 3 (three) times daily as needed for muscle spasms.    Dispense:  30 tablet    Refill:  0    Disposition:  Follow up prn  Signed, Gwynneth Aliment, MD

## 2022-12-10 NOTE — Assessment & Plan Note (Signed)
Chronic, previously uncontrolled. Encouraged to take meds as prescribed and to follow a low sodium diet. She plans to f/u with her PCP, Arnette Felts at her next visit.

## 2022-12-10 NOTE — Assessment & Plan Note (Signed)
Sounds muscular in nature, sx suggestive of sciatica. Will send rx cyclobenzaprine to use prn. She should also stretch intermittently throughout the day.  She should implement stretches to address sciatica. She agrees to come to office for further evaluation if her sx persist.

## 2022-12-11 ENCOUNTER — Other Ambulatory Visit (HOSPITAL_COMMUNITY): Payer: Self-pay

## 2022-12-11 ENCOUNTER — Encounter (HOSPITAL_COMMUNITY): Payer: Self-pay

## 2022-12-20 ENCOUNTER — Ambulatory Visit: Payer: Self-pay | Admitting: Nurse Practitioner

## 2022-12-20 NOTE — Progress Notes (Deleted)
**Note Shelby-Identified via Obfuscation** Madelaine Bhat, CMA,acting as a Neurosurgeon for Arnette Felts, FNP.,have documented all relevant documentation on the behalf of Arnette Felts, FNP,as directed by  Arnette Felts, FNP while in the presence of Arnette Felts, FNP.  Subjective:  Patient ID: Shelby Taylor , female    DOB: [birth year] , 57 y.o.   MRN: [id number]  No chief complaint on file.   HPI  Patient presented today for muscle pain, patient reports compliance with medications.  Patient denies any chest pain, SOB, or headaches. Patient has no concerns today.    Past Medical History:  Diagnosis Date  . Hx of adenomatous colonic polyps 12/20/2019  . Hypertension      Family History  Problem Relation Age of Onset  . Diabetes Mother   . Hypertension Mother   . Colon polyps Mother   . Diabetes Maternal Grandmother   . Hypertension Maternal Grandmother   . Prostate cancer Maternal Grandfather 93  . Cervical cancer Maternal Aunt 40       Deceased  . Colon cancer Maternal Aunt 40  . Colon cancer Other   . Esophageal cancer Neg Hx   . Rectal cancer Neg Hx   . Stomach cancer Neg Hx      Current Outpatient Medications:  .  amLODipine (NORVASC) 5 MG tablet, Take 1 tablet (5 mg total) by mouth daily., Disp: 90 tablet, Rfl: 3 .  Cholecalciferol (VITAMIN D3) 50 MCG (2000 UT) TABS, Take 50 mcg by mouth as directed. (Patient taking differently: Take 2,000 Units by mouth daily.), Disp: 30 tablet, Rfl: 3 .  cyclobenzaprine (FLEXERIL) 5 MG tablet, Take 1 tablet (5 mg total) by mouth 3 (three) times daily as needed for muscle spasms., Disp: 30 tablet, Rfl: 0 .  fluticasone (FLONASE) 50 MCG/ACT nasal spray, Place 1 spray into both nostrils daily as needed for allergies. (Patient not taking: Reported on 12/10/2022), Disp: , Rfl:  .  Multiple Vitamin (MULTIVITAMIN) tablet, Take 1 tablet by mouth daily., Disp: , Rfl:  .  triamcinolone ointment (KENALOG) 0.5 %, Apply 1 Application topically 2 (two) times daily. (Patient taking differently:  Apply 1 Application topically daily as needed (Rash).), Disp: 30 g, Rfl: 0   Allergies  Allergen Reactions  . Morphine And Codeine Itching     Review of Systems   There were no vitals filed for this visit. There is no height or weight on file to calculate BMI.  Wt Readings from Last 3 Encounters:  09/04/22 179 lb 9.6 oz (81.5 kg)  08/27/22 177 lb (80.3 kg)  08/14/22 174 lb (78.9 kg)    The 10-year ASCVD risk score (Arnett DK, et al., 2019) is: 11.2%   Values used to calculate the score:     Age: 37 years     Sex: Female     Is Non-Hispanic African American: Yes     Diabetic: No     Tobacco smoker: No     Systolic Blood Pressure: 158 mmHg     Is BP treated: Yes     HDL Cholesterol: 64 mg/dL     Total Cholesterol: 235 mg/dL  Objective:  Physical Exam      Assessment And Plan:  Muscle pain    No follow-ups on file.  Patient was given opportunity to ask questions. Patient verbalized understanding of the plan and was able to repeat key elements of the plan. All questions were answered to their satisfaction.    Jeanell Sparrow, FNP, have reviewed all documentation for this visit.  The documentation on 12/20/22 for the exam, diagnosis, procedures, and orders are all accurate and complete.   IF YOU HAVE BEEN REFERRED TO A SPECIALIST, IT MAY TAKE 1-2 WEEKS TO SCHEDULE/PROCESS THE REFERRAL. IF YOU HAVE NOT HEARD FROM US/SPECIALIST IN TWO WEEKS, PLEASE GIVE [place] A CALL AT [phone].

## 2023-02-26 ENCOUNTER — Ambulatory Visit: Payer: Self-pay | Admitting: Nurse Practitioner

## 2023-03-07 ENCOUNTER — Encounter: Payer: Self-pay | Admitting: *Deleted

## 2023-03-07 DIAGNOSIS — Z006 Encounter for examination for normal comparison and control in clinical research program: Secondary | ICD-10-CM

## 2023-03-07 NOTE — Research (Signed)
Participant ID: [id number]  LP(a) Result: 275.0 nmol/L  Date Resulted: January 03, 2023  Date of Coaching: 03-07-2023   [x]  (Please check once complete) Discussed Lp(a) result with participant:    What is Lipoprotein(a) Lp(a)?  What is a "normal" Lp(a) level, and when should I be concerned?  How is Lp(a) related to "bad cholesterol?"  Does a high Lp(a) level increase my risk of heart disease?  How are Lp(a) levels inherited?  Do Lp(a) levels vary by race or ethnicity?  Diagnosing High Lp(a)  What are the signs of high Lp(a)?  How to get an Lp(a) test?  What Lp(a) results are considered high? How do I get a diagnosis of elevated lipoprotein(a)?  How can I lower my Lp(a)?    During the return of results coaching session, all the topics listed above were reviewed with the participant as per the documents: Guidance for Clinician-Patient Lp(a) Risk Assessment Discussion African American Heart Study and Micro-Learning Session: High Lipoprotein(a) 101.  Participant verbalized understanding of test results and what to do next: to ask their healthcare practitioner to test Lp(a) level, advise first-degree relatives to be screened, and work to reduce all cardiovascular risk factors within their control, especially LDL cholesterol.

## 2023-03-11 ENCOUNTER — Other Ambulatory Visit (HOSPITAL_COMMUNITY): Payer: Self-pay

## 2023-07-30 ENCOUNTER — Other Ambulatory Visit (HOSPITAL_COMMUNITY): Payer: Self-pay

## 2023-08-28 ENCOUNTER — Encounter: Payer: Self-pay | Admitting: Nurse Practitioner

## 2023-08-28 ENCOUNTER — Ambulatory Visit (INDEPENDENT_AMBULATORY_CARE_PROVIDER_SITE_OTHER): Payer: Self-pay | Admitting: Nurse Practitioner

## 2023-08-28 ENCOUNTER — Other Ambulatory Visit (HOSPITAL_COMMUNITY): Payer: Self-pay

## 2023-08-28 VITALS — BP 130/82 | HR 94 | Temp 98.8°F | Ht 64.0 in | Wt 172.8 lb

## 2023-08-28 DIAGNOSIS — I119 Hypertensive heart disease without heart failure: Secondary | ICD-10-CM | POA: Diagnosis not present

## 2023-08-28 DIAGNOSIS — Z79899 Other long term (current) drug therapy: Secondary | ICD-10-CM | POA: Diagnosis not present

## 2023-08-28 DIAGNOSIS — Z6829 Body mass index (BMI) 29.0-29.9, adult: Secondary | ICD-10-CM | POA: Diagnosis not present

## 2023-08-28 DIAGNOSIS — E782 Mixed hyperlipidemia: Secondary | ICD-10-CM | POA: Diagnosis not present

## 2023-08-28 DIAGNOSIS — Z0000 Encounter for general adult medical examination without abnormal findings: Secondary | ICD-10-CM

## 2023-08-28 DIAGNOSIS — E663 Overweight: Secondary | ICD-10-CM

## 2023-08-28 DIAGNOSIS — R319 Hematuria, unspecified: Secondary | ICD-10-CM

## 2023-08-28 DIAGNOSIS — E559 Vitamin D deficiency, unspecified: Secondary | ICD-10-CM | POA: Diagnosis not present

## 2023-08-28 DIAGNOSIS — Z23 Encounter for immunization: Secondary | ICD-10-CM

## 2023-08-28 DIAGNOSIS — Z1231 Encounter for screening mammogram for malignant neoplasm of breast: Secondary | ICD-10-CM

## 2023-08-28 DIAGNOSIS — I2510 Atherosclerotic heart disease of native coronary artery without angina pectoris: Secondary | ICD-10-CM | POA: Diagnosis not present

## 2023-08-28 DIAGNOSIS — Z2821 Immunization not carried out because of patient refusal: Secondary | ICD-10-CM

## 2023-08-28 LAB — POCT URINALYSIS DIP (CLINITEK)
Bilirubin, UA: NEGATIVE
Glucose, UA: NEGATIVE mg/dL
Ketones, POC UA: NEGATIVE mg/dL
Leukocytes, UA: NEGATIVE
Nitrite, UA: NEGATIVE
POC PROTEIN,UA: NEGATIVE
Spec Grav, UA: 1.015 (ref 1.010–1.025)
Urobilinogen, UA: 0.2 U/dL
pH, UA: 7 (ref 5.0–8.0)

## 2023-08-28 MED ORDER — AMLODIPINE BESYLATE 5 MG PO TABS
5.0000 mg | ORAL_TABLET | Freq: Every day | ORAL | 3 refills | Status: AC
  Filled 2023-08-28 – 2024-01-13 (×2): qty 90, 90d supply, fill #0
  Filled 2024-05-19: qty 90, 90d supply, fill #1

## 2023-08-28 NOTE — Progress Notes (Signed)
 [place], CMA,acting as a Neurosurgeon for Susanna Epley, FNP.,have documented all relevant documentation on the behalf of Susanna Epley, FNP,as directed by  Susanna Epley, FNP while in the presence of Susanna Epley, FNP.  Subjective:  Patient ID: Shelby Taylor , female    DOB: [birth year] , 58 y.o.   MRN: [id number]  Chief Complaint  Patient presents with   Annual Exam    Patient presents today for HM, Patient reports compliance with medication. Patient denies any chest pain, SOB, or headaches. Patient has no concerns today.     HPI  She has been doing well overall. 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 She is working at [employer] with [employer]. She has been doing better with exercising. She is being more intentional. She is exercising 3 times a week. She is being intentional with her diet trying to get the right amount of protein. She will snack on fruit. She is in [employer] school at [employer].      Past Medical History:  Diagnosis Date   Hx of adenomatous colonic polyps 12/20/2019   Hypertension      Family History  Problem Relation Age of Onset   Diabetes Mother    Hypertension Mother    Colon polyps Mother    Diabetes Maternal Grandmother    Hypertension Maternal Grandmother    Prostate cancer Maternal Grandfather 8   Cervical cancer Maternal Aunt 41       Deceased   Colon cancer Maternal Aunt 40   Colon cancer Other    Esophageal cancer Neg Hx    Rectal cancer Neg Hx    Stomach cancer Neg Hx      Current Outpatient Medications:    Cholecalciferol  (VITAMIN D3) 50 MCG (2000 UT) TABS, Take 50 mcg by mouth as directed. (Patient taking differently: Take 2,000 Units by mouth daily.), Disp: 30 tablet, Rfl: 3   cyclobenzaprine  (FLEXERIL ) 5 MG tablet, Take 1 tablet (5 mg total) by mouth 3 (three) times daily as needed for muscle spasms., Disp: 30 tablet, Rfl: 0   Multiple Vitamin (MULTIVITAMIN) tablet, Take 1 tablet by mouth daily., Disp: , Rfl:    triamcinolone   ointment (KENALOG ) 0.5 %, Apply 1 Application topically 2 (two) times daily. (Patient taking differently: Apply 1 Application topically daily as needed (Rash).), Disp: 30 g, Rfl: 0   amLODipine  (NORVASC ) 5 MG tablet, Take 1 tablet (5 mg total) by mouth daily., Disp: 90 tablet, Rfl: 3   fluticasone (FLONASE) 50 MCG/ACT nasal spray, Place 1 spray into both nostrils daily as needed for allergies. (Patient not taking: Reported on 12/10/2022), Disp: , Rfl:    Allergies  Allergen Reactions   Morphine And Codeine Itching     Review of Systems  Constitutional: Negative.   HENT: Negative.    Eyes: Negative.   Respiratory: Negative.    Cardiovascular: Negative.   Gastrointestinal: Negative.   Endocrine: Negative.   Genitourinary: Negative.   Musculoskeletal: Negative.   Skin: Negative.   Allergic/Immunologic: Negative.   Neurological: Negative.   Hematological: Negative.   Psychiatric/Behavioral: Negative.       Today's Vitals   08/28/23 0953  BP: 130/82  Pulse: 94  Temp: 98.8 F (37.1 C)  TempSrc: Oral  Weight: 172 lb 12.8 oz (78.4 kg)  Height: 5\' 4"  (1.626 m)  PainSc: 0-No pain   Body mass index is 29.66 kg/m.  Wt Readings from Last 3 Encounters:  08/28/23 172 lb 12.8 oz (78.4 kg)  09/04/22 179 lb  9.6 oz (81.5 kg)  08/27/22 177 lb (80.3 kg)     Objective:  Physical Exam Vitals and nursing note reviewed.  Constitutional:      General: She is not in acute distress.    Appearance: Normal appearance. She is well-developed. She is obese.  HENT:     Head: Normocephalic and atraumatic.     Right Ear: Hearing, tympanic membrane, ear canal and external ear normal. There is no impacted cerumen.     Left Ear: Hearing, tympanic membrane, ear canal and external ear normal. There is no impacted cerumen.     Nose: Nose normal.     Mouth/Throat:     Mouth: Mucous membranes are moist.  Eyes:     General: Lids are normal.     Extraocular Movements: Extraocular movements intact.      Conjunctiva/sclera: Conjunctivae normal.     Pupils: Pupils are equal, round, and reactive to light.     Funduscopic exam:    Right eye: No papilledema.        Left eye: No papilledema.  Neck:     Thyroid : No thyroid  mass.     Vascular: No carotid bruit.  Cardiovascular:     Rate and Rhythm: Normal rate and regular rhythm.     Pulses: Normal pulses.     Heart sounds: Normal heart sounds. No murmur heard. Pulmonary:     Effort: Pulmonary effort is normal. No respiratory distress.     Breath sounds: Normal breath sounds. No wheezing.  Abdominal:     General: Abdomen is flat. Bowel sounds are normal. There is no distension.     Palpations: Abdomen is soft.     Tenderness: There is no abdominal tenderness.  Musculoskeletal:        General: No swelling. Normal range of motion.     Cervical back: Full passive range of motion without pain, normal range of motion and neck supple.     Right lower leg: No edema.     Left lower leg: No edema.  Skin:    General: Skin is warm and dry.     Capillary Refill: Capillary refill takes less than 2 seconds.  Neurological:     General: No focal deficit present.     Mental Status: She is alert and oriented to person, place, and time.     Cranial Nerves: No cranial nerve deficit.     Sensory: No sensory deficit.     Motor: No weakness.  Psychiatric:        Mood and Affect: Mood normal.        Behavior: Behavior normal.        Thought Content: Thought content normal.        Judgment: Judgment normal.      Assessment And Plan:  Encounter for annual health examination Assessment & Plan: Behavior modifications discussed and diet history reviewed.   Pt will continue to exercise regularly and modify diet with low GI, plant based foods and decrease intake of processed foods.  Recommend intake of daily multivitamin, Vitamin D , and calcium.  Recommend mammogram and colonoscopy for preventive screenings, as well as recommend immunizations that include  influenza, TDAP, and Shingles    Encounter for screening mammogram for breast cancer Assessment & Plan: Pt instructed on Self Breast Exam.According to ACOG guidelines Women aged 69 and older are recommended to get an annual mammogram. Form completed and given to patient contact the [facility] for appointment scheduing.  Pt encouraged to  get annual mammogram   Orders: -     3D Screening Mammogram w/Implants, Left and Right; Future  Mixed hyperlipidemia Assessment & Plan: Cholesterol levels are stable, no current medications  Orders: -     Lipid panel  Hypertensive heart disease without heart failure Assessment & Plan: Blood pressure is fairly controlled continue current medications  Orders: -     EKG 12-Lead -     POCT URINALYSIS DIP (CLINITEK) -     Microalbumin / creatinine urine ratio -     CMP14+EGFR  Coronary artery disease involving native coronary artery of native heart without angina pectoris Assessment & Plan: She was diagnosed by cardiology, she is not taking a statin at this time. It was described as mild.   Vitamin D  deficiency Assessment & Plan: Will check vitamin D  level and supplement as needed.    Also encouraged to spend 15 minutes in the sun daily.    Orders: -     VITAMIN D  25 Hydroxy (Vit-D Deficiency, Fractures)  COVID-19 vaccine administered Assessment & Plan: Covid 19 vaccine given in office observed for 15 minutes without any adverse reaction   Orders: -     Pfizer Comirnaty Covid-19 Vaccine 20yrs & older  Need for pneumococcal vaccination Assessment & Plan: Prevnar 20 given in office  Orders: -     Pneumococcal conjugate vaccine 20-valent  Herpes zoster vaccination declined Assessment & Plan: Declines shingrix , educated on disease process and is aware if he changes his mind to notify office Will give at next visit.   Overweight with body mass index (BMI) of 29 to 29.9 in adult  Other long term (current) drug therapy -      CBC with Differential/Platelet  Hematuria, unspecified type Assessment & Plan: Small blood will send urine culture she has had moderate blood in urine in the past.   Orders: -     Urine Culture  Other orders -     amLODIPine  Besylate; Take 1 tablet (5 mg total) by mouth daily.  Dispense: 90 tablet; Refill: 3    Return for 1 year physical, 6 month bp check.  Patient was given opportunity to ask questions. Patient verbalized understanding of the plan and was able to repeat key elements of the plan. All questions were answered to their satisfaction.    Inge Mangle, FNP, have reviewed all documentation for this visit. The documentation on 08/28/23 for the exam, diagnosis, procedures, and orders are all accurate and complete.   IF YOU HAVE BEEN REFERRED TO A SPECIALIST, IT MAY TAKE 1-2 WEEKS TO SCHEDULE/PROCESS THE REFERRAL. IF YOU HAVE NOT HEARD FROM US /SPECIALIST IN TWO WEEKS, PLEASE GIVE US  A CALL AT [phone].

## 2023-08-29 LAB — CBC WITH DIFFERENTIAL/PLATELET
Basophils Absolute: 0 10*3/uL (ref 0.0–0.2)
Basos: 0 %
EOS (ABSOLUTE): 0.1 10*3/uL (ref 0.0–0.4)
Eos: 1 %
Hematocrit: 41 % (ref 34.0–46.6)
Hemoglobin: 13.1 g/dL (ref 11.1–15.9)
Immature Grans (Abs): 0 10*3/uL (ref 0.0–0.1)
Immature Granulocytes: 0 %
Lymphocytes Absolute: 1.4 10*3/uL (ref 0.7–3.1)
Lymphs: 32 %
MCH: 26.6 pg (ref 26.6–33.0)
MCHC: 32 g/dL (ref 31.5–35.7)
MCV: 83 fL (ref 79–97)
Monocytes Absolute: 0.4 10*3/uL (ref 0.1–0.9)
Monocytes: 8 %
Neutrophils Absolute: 2.6 10*3/uL (ref 1.4–7.0)
Neutrophils: 59 %
Platelets: 258 10*3/uL (ref 150–450)
RBC: 4.93 x10E6/uL (ref 3.77–5.28)
RDW: 12.6 % (ref 11.7–15.4)
WBC: 4.5 10*3/uL (ref 3.4–10.8)

## 2023-08-29 LAB — CMP14+EGFR
ALT: 21 IU/L (ref 0–32)
AST: 19 IU/L (ref 0–40)
Albumin: 4.4 g/dL (ref 3.8–4.9)
Alkaline Phosphatase: 115 IU/L (ref 44–121)
BUN/Creatinine Ratio: 19 (ref 9–23)
BUN: 13 mg/dL (ref 6–24)
Bilirubin Total: 0.7 mg/dL (ref 0.0–1.2)
CO2: 23 mmol/L (ref 20–29)
Calcium: 9.6 mg/dL (ref 8.7–10.2)
Chloride: 103 mmol/L (ref 96–106)
Creatinine, Ser: 0.69 mg/dL (ref 0.57–1.00)
Globulin, Total: 2.2 g/dL (ref 1.5–4.5)
Glucose: 87 mg/dL (ref 70–99)
Potassium: 4 mmol/L (ref 3.5–5.2)
Sodium: 142 mmol/L (ref 134–144)
Total Protein: 6.6 g/dL (ref 6.0–8.5)
eGFR: 101 mL/min/{1.73_m2} (ref >59)

## 2023-08-29 LAB — LIPID PANEL
Chol/HDL Ratio: 3.8 ratio (ref 0.0–4.4)
Cholesterol, Total: 231 mg/dL — ABNORMAL HIGH (ref 100–199)
HDL: 61 mg/dL (ref >39)
LDL Chol Calc (NIH): 157 mg/dL — ABNORMAL HIGH (ref 0–99)
Triglycerides: 75 mg/dL (ref 0–149)
VLDL Cholesterol Cal: 13 mg/dL (ref 5–40)

## 2023-08-29 LAB — MICROALBUMIN / CREATININE URINE RATIO
Creatinine, Urine: 29.2 mg/dL
Microalb/Creat Ratio: 10 mg/g{creat} (ref 0–29)
Microalbumin, Urine: 3 ug/mL

## 2023-08-29 LAB — VITAMIN D 25 HYDROXY (VIT D DEFICIENCY, FRACTURES): Vit D, 25-Hydroxy: 24 ng/mL — ABNORMAL LOW (ref 30.0–100.0)

## 2023-08-30 LAB — URINE CULTURE

## 2023-09-08 ENCOUNTER — Encounter: Payer: Self-pay | Admitting: Nurse Practitioner

## 2023-09-08 DIAGNOSIS — R319 Hematuria, unspecified: Secondary | ICD-10-CM | POA: Insufficient documentation

## 2023-09-08 NOTE — Assessment & Plan Note (Signed)
 Cholesterol levels are stable, no current medications

## 2023-09-08 NOTE — Assessment & Plan Note (Signed)
 Small blood will send urine culture she has had moderate blood in urine in the past.

## 2023-09-08 NOTE — Assessment & Plan Note (Signed)
 Covid 19 vaccine given in office observed for 15 minutes without any adverse reaction

## 2023-09-08 NOTE — Assessment & Plan Note (Signed)
 Will check vitamin D level and supplement as needed.    Also encouraged to spend 15 minutes in the sun daily.

## 2023-09-08 NOTE — Assessment & Plan Note (Signed)
 Prevnar 20 given in office.

## 2023-09-08 NOTE — Assessment & Plan Note (Signed)
Pt instructed on Self Breast Exam.According to ACOG guidelines Women aged 58 and older are recommended to get an annual mammogram. Form completed and given to patient contact the [facility] for appointment scheduing.  Pt encouraged to get annual mammogram

## 2023-09-08 NOTE — Assessment & Plan Note (Signed)

## 2023-09-08 NOTE — Assessment & Plan Note (Signed)
 Blood pressure is fairly controlled continue current medications

## 2023-09-08 NOTE — Assessment & Plan Note (Signed)
 She was diagnosed by cardiology, she is not taking a statin at this time. It was described as mild.

## 2023-09-08 NOTE — Assessment & Plan Note (Signed)
 Declines shingrix , educated on disease process and is aware if he changes his mind to notify office Will give at next visit.

## 2023-09-10 ENCOUNTER — Other Ambulatory Visit: Payer: Self-pay | Admitting: Nurse Practitioner

## 2023-09-10 ENCOUNTER — Other Ambulatory Visit (HOSPITAL_COMMUNITY): Payer: Self-pay

## 2023-09-10 DIAGNOSIS — E782 Mixed hyperlipidemia: Secondary | ICD-10-CM

## 2023-09-10 DIAGNOSIS — R3121 Asymptomatic microscopic hematuria: Secondary | ICD-10-CM

## 2023-09-10 MED ORDER — ATORVASTATIN CALCIUM 10 MG PO TABS
10.0000 mg | ORAL_TABLET | Freq: Every evening | ORAL | 2 refills | Status: DC
Start: 2023-09-10 — End: 2024-02-27
  Filled 2023-09-10: qty 30, 30d supply, fill #0
  Filled 2023-10-23: qty 30, 30d supply, fill #1
  Filled 2024-02-17: qty 30, 30d supply, fill #2

## 2023-09-11 ENCOUNTER — Ambulatory Visit: Payer: Self-pay | Admitting: Nurse Practitioner

## 2023-09-11 ENCOUNTER — Telehealth: Payer: Self-pay

## 2023-09-11 ENCOUNTER — Other Ambulatory Visit (HOSPITAL_COMMUNITY): Payer: Self-pay

## 2023-09-11 ENCOUNTER — Ambulatory Visit
Admission: RE | Admit: 2023-09-11 | Discharge: 2023-09-11 | Disposition: A | Discharge status: Home / Self Care | Source: Ambulatory Visit | Attending: Nurse Practitioner | Admitting: Nurse Practitioner

## 2023-09-11 DIAGNOSIS — R319 Hematuria, unspecified: Secondary | ICD-10-CM | POA: Diagnosis not present

## 2023-09-11 DIAGNOSIS — R3121 Asymptomatic microscopic hematuria: Secondary | ICD-10-CM

## 2023-09-11 NOTE — Telephone Encounter (Signed)
 Called patient to see if she could come 09/18/2023 for the mobile mammogram bus.

## 2023-09-18 ENCOUNTER — Inpatient Hospital Stay: Admission: RE | Admit: 2023-09-18 | Source: Ambulatory Visit

## 2023-09-25 ENCOUNTER — Ambulatory Visit

## 2023-09-26 ENCOUNTER — Ambulatory Visit
Admission: RE | Admit: 2023-09-26 | Discharge: 2023-09-26 | Disposition: A | Discharge status: Home / Self Care | Source: Ambulatory Visit | Attending: Nurse Practitioner | Admitting: Nurse Practitioner

## 2023-09-26 DIAGNOSIS — Z1231 Encounter for screening mammogram for malignant neoplasm of breast: Secondary | ICD-10-CM

## 2024-01-13 ENCOUNTER — Other Ambulatory Visit (HOSPITAL_COMMUNITY): Payer: Self-pay

## 2024-02-27 ENCOUNTER — Other Ambulatory Visit (HOSPITAL_COMMUNITY): Payer: Self-pay

## 2024-02-27 ENCOUNTER — Ambulatory Visit: Admitting: Nurse Practitioner

## 2024-02-27 ENCOUNTER — Encounter: Payer: Self-pay | Admitting: Nurse Practitioner

## 2024-02-27 VITALS — BP 130/80 | HR 95 | Temp 98.0°F | Ht 64.0 in | Wt 182.8 lb

## 2024-02-27 DIAGNOSIS — J3089 Other allergic rhinitis: Secondary | ICD-10-CM

## 2024-02-27 DIAGNOSIS — E782 Mixed hyperlipidemia: Secondary | ICD-10-CM | POA: Diagnosis not present

## 2024-02-27 DIAGNOSIS — Z2821 Immunization not carried out because of patient refusal: Secondary | ICD-10-CM

## 2024-02-27 DIAGNOSIS — I119 Hypertensive heart disease without heart failure: Secondary | ICD-10-CM

## 2024-02-27 MED ORDER — ATORVASTATIN CALCIUM 10 MG PO TABS
10.0000 mg | ORAL_TABLET | Freq: Every evening | ORAL | 1 refills | Status: AC
  Filled 2024-02-27 – 2024-05-19 (×2): qty 90, 90d supply, fill #0

## 2024-02-27 NOTE — Progress Notes (Signed)
 LILLETTE Kristeen JINNY Gladis, CMA,acting as a neurosurgeon for Gaines Ada, FNP.,have documented all relevant documentation on the behalf of Gaines Ada, FNP,as directed by  Gaines Ada, FNP while in the presence of Gaines Ada, FNP.  Subjective:  Patient ID: Shelby Taylor , female    DOB: Oct 21, 1965 , 58 y.o.   MRN: [id number]  Chief Complaint  Patient presents with   Hypertension    Patient presents today for a bp and chol follow up, Patient reports compliance with medication. Patient denies any chest pain, SOB, or headaches. Patient has no concerns today.     HPI  Discussed the use of AI scribe software for clinical note transcription with the patient, who gave verbal consent to proceed.  History of Present Illness Shelby Taylor is a 58 year old female with hypertension who presents for a blood pressure follow-up.  She is currently taking Amlodipine  and has three refills remaining. She recently refilled her cholesterol medication and is tolerating it well.  She has been experiencing persistent phlegm since receiving the COVID and pneumonia vaccines in April. The phlegm is described as sitting in her throat and has not resolved despite using Mucinex . She has a history of allergies and has been using a nasal spray to manage symptoms. She previously took Zyrtec but stopped due to excessive drowsiness. She avoids dairy and yogurt, suspecting they might contribute to phlegm production. Despite these measures, the phlegm persists, though it is less bothersome than before.  In terms of her diet, she mentions an unusual breakfast of grits and a German bologna sandwich, which is atypical for her as she usually has oatmeal. She generally does not eat in the mornings.   Past Medical History:  Diagnosis Date   Hx of adenomatous colonic polyps 12/20/2019   Hypertension      Family History  Problem Relation Age of Onset   Diabetes Mother    Hypertension Mother    Colon polyps Mother    Cervical cancer  Maternal Aunt 68       Deceased   Colon cancer Maternal Aunt 40   Diabetes Maternal Grandmother    Hypertension Maternal Grandmother    Prostate cancer Maternal Grandfather 79   Colon cancer Other    Esophageal cancer Neg Hx    Rectal cancer Neg Hx    Stomach cancer Neg Hx    Breast cancer Neg Hx      Current Outpatient Medications:    amLODipine  (NORVASC ) 5 MG tablet, Take 1 tablet (5 mg total) by mouth daily., Disp: 90 tablet, Rfl: 3   Cholecalciferol  (VITAMIN D3) 50 MCG (2000 UT) TABS, Take 50 mcg by mouth as directed. (Patient taking differently: Take 2,000 Units by mouth daily.), Disp: 30 tablet, Rfl: 3   cyclobenzaprine  (FLEXERIL ) 5 MG tablet, Take 1 tablet (5 mg total) by mouth 3 (three) times daily as needed for muscle spasms., Disp: 30 tablet, Rfl: 0   Multiple Vitamin (MULTIVITAMIN) tablet, Take 1 tablet by mouth daily., Disp: , Rfl:    triamcinolone  ointment (KENALOG ) 0.5 %, Apply 1 Application topically 2 (two) times daily. (Patient taking differently: Apply 1 Application topically daily as needed (Rash).), Disp: 30 g, Rfl: 0   atorvastatin  (LIPITOR) 10 MG tablet, Take 1 tablet (10 mg total) by mouth every evening., Disp: 90 tablet, Rfl: 1   fluticasone (FLONASE) 50 MCG/ACT nasal spray, Place 1 spray into both nostrils daily as needed for allergies. (Patient not taking: Reported on 02/27/2024), Disp: , Rfl:    Allergies  Allergen Reactions   Morphine And Codeine Itching     Review of Systems  Constitutional: Negative.   Respiratory: Negative.    Cardiovascular: Negative.   Psychiatric/Behavioral: Negative.    All other systems reviewed and are negative.    Today's Vitals   02/27/24 1004  BP: 130/80  Pulse: 95  Temp: 98 F (36.7 C)  TempSrc: Oral  Weight: 182 lb 12.8 oz (82.9 kg)  Height: 5' 4 (1.626 m)  PainSc: 0-No pain   Body mass index is 31.38 kg/m.  Wt Readings from Last 3 Encounters:  02/27/24 182 lb 12.8 oz (82.9 kg)  08/28/23 172 lb 12.8 oz  (78.4 kg)  09/04/22 179 lb 9.6 oz (81.5 kg)     Objective:  Physical Exam Vitals and nursing note reviewed.  Constitutional:      General: She is not in acute distress.    Appearance: Normal appearance. She is obese.  Cardiovascular:     Rate and Rhythm: Normal rate.     Pulses: Normal pulses.     Heart sounds: Normal heart sounds. No murmur heard. Pulmonary:     Effort: Pulmonary effort is normal. No respiratory distress.     Breath sounds: Normal breath sounds. No wheezing.  Skin:    General: Skin is warm and dry.     Capillary Refill: Capillary refill takes less than 2 seconds.     Findings: No rash.  Neurological:     General: No focal deficit present.     Mental Status: She is alert and oriented to person, place, and time.     Cranial Nerves: No cranial nerve deficit.     Motor: No weakness.  Psychiatric:        Mood and Affect: Mood normal.        Behavior: Behavior normal.        Thought Content: Thought content normal.        Judgment: Judgment normal.         Assessment And Plan:  Hypertensive heart disease without heart failure Assessment & Plan: Blood pressure well-controlled despite night shifts. - Continue current antihypertensive regimen.  Orders: -     BMP8+eGFR  Mixed hyperlipidemia Assessment & Plan: Tolerating cholesterol medication well. - Change cholesterol medication prescription to a 90-day supply.  Orders: -     Lipid panel -     Atorvastatin  Calcium ; Take 1 tablet (10 mg total) by mouth every evening.  Dispense: 90 tablet; Refill: 1  Herpes zoster vaccination declined  Non-seasonal allergic rhinitis, unspecified trigger Assessment & Plan: Chronic postnasal drainage likely allergy-related. Nasal spray provides relief. Zyrtec caused drowsiness. - Try Zyrtec at night for 5-7 days. - Continue nasal spray with steroid. - Provide Zyrtec samples if available. - Consider steroid treatment if symptoms persist.     Return for keep same  next.  Patient was given opportunity to ask questions. Patient verbalized understanding of the plan and was able to repeat key elements of the plan. All questions were answered to their satisfaction.   LILLETTE Gaines Ada, FNP, have reviewed all documentation for this visit. The documentation on 02/27/24 for the exam, diagnosis, procedures, and orders are all accurate and complete.    IF YOU HAVE BEEN REFERRED TO A SPECIALIST, IT MAY TAKE 1-2 WEEKS TO SCHEDULE/PROCESS THE REFERRAL. IF YOU HAVE NOT HEARD FROM US /SPECIALIST IN TWO WEEKS, PLEASE GIVE US  A CALL AT [phone] X [phone].

## 2024-02-28 LAB — LIPID PANEL
Chol/HDL Ratio: 3.2 ratio (ref 0.0–4.4)
Cholesterol, Total: 189 mg/dL (ref 100–199)
HDL: 59 mg/dL (ref >39)
LDL Chol Calc (NIH): 118 mg/dL — ABNORMAL HIGH (ref 0–99)
Triglycerides: 62 mg/dL (ref 0–149)
VLDL Cholesterol Cal: 12 mg/dL (ref 5–40)

## 2024-02-28 LAB — BMP8+EGFR
BUN/Creatinine Ratio: 21 (ref 9–23)
BUN: 15 mg/dL (ref 6–24)
CO2: 25 mmol/L (ref 20–29)
Calcium: 9.4 mg/dL (ref 8.7–10.2)
Chloride: 103 mmol/L (ref 96–106)
Creatinine, Ser: 0.7 mg/dL (ref 0.57–1.00)
Glucose: 117 mg/dL — ABNORMAL HIGH (ref 70–99)
Potassium: 4.2 mmol/L (ref 3.5–5.2)
Sodium: 140 mmol/L (ref 134–144)
eGFR: 100 mL/min/1.73 (ref >59)

## 2024-03-08 ENCOUNTER — Ambulatory Visit: Payer: Self-pay | Admitting: Nurse Practitioner

## 2024-03-08 DIAGNOSIS — I119 Hypertensive heart disease without heart failure: Secondary | ICD-10-CM | POA: Insufficient documentation

## 2024-03-08 DIAGNOSIS — J3089 Other allergic rhinitis: Secondary | ICD-10-CM | POA: Insufficient documentation

## 2024-03-08 NOTE — Assessment & Plan Note (Signed)
 Blood pressure well-controlled despite night shifts. - Continue current antihypertensive regimen.

## 2024-03-08 NOTE — Assessment & Plan Note (Signed)
 Tolerating cholesterol medication well. - Change cholesterol medication prescription to a 90-day supply.

## 2024-03-08 NOTE — Assessment & Plan Note (Signed)
 Chronic postnasal drainage likely allergy-related. Nasal spray provides relief. Zyrtec caused drowsiness. - Try Zyrtec at night for 5-7 days. - Continue nasal spray with steroid. - Provide Zyrtec samples if available. - Consider steroid treatment if symptoms persist.

## 2024-05-19 ENCOUNTER — Other Ambulatory Visit (HOSPITAL_COMMUNITY): Payer: Self-pay

## 2024-05-19 ENCOUNTER — Other Ambulatory Visit: Payer: Self-pay

## 2024-08-31 ENCOUNTER — Encounter: Payer: Self-pay | Admitting: Nurse Practitioner
